# Patient Record
Sex: Female | Born: 1935 | Race: White | Hispanic: No | Marital: Single | Smoking: Never smoker
Health system: Southern US, Community
[De-identification: ages and names within clinical notes are randomized; demographics above are authoritative.]

## PROBLEM LIST (undated history)

## (undated) DIAGNOSIS — F0390 Unspecified dementia without behavioral disturbance: Secondary | ICD-10-CM

## (undated) DIAGNOSIS — I10 Essential (primary) hypertension: Secondary | ICD-10-CM

---

## 2016-09-23 DIAGNOSIS — F0150 Vascular dementia without behavioral disturbance: Secondary | ICD-10-CM | POA: Insufficient documentation

## 2018-12-29 DIAGNOSIS — M150 Primary generalized (osteo)arthritis: Secondary | ICD-10-CM | POA: Diagnosis present

## 2018-12-29 DIAGNOSIS — M159 Polyosteoarthritis, unspecified: Secondary | ICD-10-CM | POA: Diagnosis present

## 2019-05-05 ENCOUNTER — Other Ambulatory Visit: Payer: Self-pay

## 2019-05-05 ENCOUNTER — Encounter: Payer: Self-pay | Admitting: Emergency Medicine

## 2019-05-05 ENCOUNTER — Emergency Department
Admission: EM | Admit: 2019-05-05 | Discharge: 2019-05-05 | Disposition: A | Discharge status: Home / Self Care | Payer: Medicare HMO | Attending: Emergency Medicine | Admitting: Emergency Medicine

## 2019-05-05 ENCOUNTER — Emergency Department: Payer: Medicare HMO

## 2019-05-05 DIAGNOSIS — S99921A Unspecified injury of right foot, initial encounter: Secondary | ICD-10-CM | POA: Diagnosis present

## 2019-05-05 DIAGNOSIS — W19XXXA Unspecified fall, initial encounter: Secondary | ICD-10-CM | POA: Insufficient documentation

## 2019-05-05 DIAGNOSIS — Y939 Activity, unspecified: Secondary | ICD-10-CM | POA: Diagnosis not present

## 2019-05-05 DIAGNOSIS — S92414A Nondisplaced fracture of proximal phalanx of right great toe, initial encounter for closed fracture: Secondary | ICD-10-CM | POA: Diagnosis not present

## 2019-05-05 DIAGNOSIS — Y929 Unspecified place or not applicable: Secondary | ICD-10-CM | POA: Diagnosis not present

## 2019-05-05 DIAGNOSIS — Y999 Unspecified external cause status: Secondary | ICD-10-CM | POA: Diagnosis not present

## 2019-05-05 MED ORDER — ACETAMINOPHEN 500 MG PO TABS
1000.0000 mg | ORAL_TABLET | Freq: Once | ORAL | Status: AC
  Administered 2019-05-05: 1000 mg via ORAL
  Filled 2019-05-05: qty 2

## 2019-05-05 NOTE — ED Notes (Signed)
This RN spoke with Amy, staff at group home about discharge. States they will be here to pick up pt in 66min

## 2019-05-05 NOTE — ED Notes (Signed)
Legal guardian Leroy Libman attempted to call at this time with no success

## 2019-05-05 NOTE — ED Provider Notes (Signed)
[facility] Emergency Department Provider Note       Time seen: ----------------------------------------- 9:31 AM on 05/05/2019 -----------------------------------------   I have reviewed the triage vital signs and the nursing notes.  HISTORY   Chief Complaint No chief complaint on file.    HPI Kayla Fowler is a 85 y.o. female with unknown past medical history who presents to the ED for right great toe pain after unwitnessed fall.  She arrives alert and oriented to place and person but is otherwise disoriented.  We are not sure if she is at her baseline mentally or not.  She currently lives in a group home.  Reportedly she injured her right great toe.  No past medical history on file.  There are no active problems to display for this patient.  Allergies Ibuprofen and Percocet [oxycodone-acetaminophen]  Social History Social History   Tobacco Use  . Smoking status: Not on file  Substance Use Topics  . Alcohol use: Not on file  . Drug use: Not on file   Review of Systems Constitutional: Negative for fever. Cardiovascular: Negative for chest pain. Respiratory: Negative for shortness of breath. Gastrointestinal: Negative for abdominal pain, vomiting and diarrhea. Musculoskeletal: Positive for right great toe pain Skin: Positive for right great toe and right lower extremity bruising Neurological: Negative for headaches, focal weakness or numbness.  All systems negative/normal/unremarkable except as stated in the HPI  ____________________________________________   PHYSICAL EXAM:  VITAL SIGNS: ED Triage Vitals [05/05/19 0929]  Enc Vitals Group     BP (!) 185/95     Pulse Rate 81     Resp 14     Temp      Temp Source Oral     SpO2 100 %     Weight      Height      Head Circumference      Peak Flow      Pain Score      Pain Loc      Pain Edu?      Excl. in [place]?    Constitutional: Alert but disoriented.  No acute distress Eyes:  Conjunctivae are normal. Normal extraocular movements. ENT      Head: Normocephalic and atraumatic.      Nose: No congestion/rhinnorhea.      Mouth/Throat: Mucous membranes are moist.      Neck: No stridor. Cardiovascular: Normal rate, regular rhythm. No murmurs, rubs, or gallops. Respiratory: Normal respiratory effort without tachypnea nor retractions. Breath sounds are clear and equal bilaterally. No wheezes/rales/rhonchi. Musculoskeletal: There are bruises over the right pretibial area, there is a chronic appearing deformity of the right great toe with significant valgus and bunion formation.  The right great toe overlaps the second digit.  There is dorsal bruising noted Neurologic:  Normal speech and language. No gross focal neurologic deficits are appreciated.  Skin: Contusion noted over the right distal shin, contusion noted over the dorsum of the right great toe ____________________________________________  ED COURSE:  As part of my medical decision making, I reviewed the following data within the electronic MEDICAL RECORD NUMBER History obtained from family if available, nursing notes, old chart and ekg, as well as notes from prior ED visits. Patient presented for a fall with right great toe pain, we will assess with labs and imaging as indicated at this time.   Procedures  Kayla Fowler was evaluated in Emergency Department on 05/05/2019 for the symptoms described in the history of present illness. She was evaluated in  the context of the global COVID-19 pandemic, which necessitated consideration that the patient might be at risk for infection with the SARS-CoV-2 virus that causes COVID-19. Institutional protocols and algorithms that pertain to the evaluation of patients at risk for COVID-19 are in a state of rapid change based on information released by regulatory bodies including the CDC and federal and state organizations. These policies and algorithms were followed during the patient's care  in the ED.  ____________________________________________   RADIOLOGY Images were viewed by me  Right great toe x-ray IMPRESSION:  Transverse nondisplaced fracture at base of proximal phalanx RIGHT  great toe.   Hallux valgus with bunion deformity.  ____________________________________________   DIFFERENTIAL DIAGNOSIS   Fracture, contusion, dislocation, chronic deformity  FINAL ASSESSMENT AND PLAN  Right great toe pain, proximal phalanx fracture   Plan: The patient had presented for right great toe pain after an unwitnessed fall.  Patient's imaging did reveal a proximal phalanx fracture.  She was placed in a cast shoe and is otherwise cleared for outpatient follow-up.   Laurence Aly, MD    Note: This note was generated in part or whole with voice recognition software. Voice recognition is usually quite accurate but there are transcription errors that can and very often do occur. I apologize for any typographical errors that were not detected and corrected.     Earleen Newport, MD 05/05/19 1120

## 2019-05-05 NOTE — ED Notes (Signed)
Patient ambulatory to restroom with 1 person assistance. Patient complaining of pain in right foot when walking.

## 2019-05-05 NOTE — ED Notes (Addendum)
Group home called at this time. [phone]  Per staff Amy,  pt had mechanical fall this am Pt is at baseline cognition A&OX2 and deformity to toe is at baseline  MD aware

## 2019-05-05 NOTE — ED Notes (Signed)
Pt given to staff from group home for discharge , pt unable to sign e-signature due to baseline cognition

## 2019-05-05 NOTE — ED Triage Notes (Signed)
PT from group home, [place]. C/o RT great toe pain after unwitnessed fall. Pt is A&Ox2, possible at baseline but EMS unaware. VSS. MD at bedside

## 2019-05-13 ENCOUNTER — Emergency Department: Payer: Medicare HMO

## 2019-05-13 ENCOUNTER — Other Ambulatory Visit: Payer: Self-pay

## 2019-05-13 ENCOUNTER — Emergency Department
Admission: EM | Admit: 2019-05-13 | Discharge: 2019-05-13 | Disposition: A | Discharge status: Home / Self Care | Payer: Medicare HMO | Attending: Emergency Medicine | Admitting: Emergency Medicine

## 2019-05-13 DIAGNOSIS — Y92122 Bedroom in nursing home as the place of occurrence of the external cause: Secondary | ICD-10-CM | POA: Insufficient documentation

## 2019-05-13 DIAGNOSIS — W06XXXA Fall from bed, initial encounter: Secondary | ICD-10-CM | POA: Insufficient documentation

## 2019-05-13 DIAGNOSIS — S0181XA Laceration without foreign body of other part of head, initial encounter: Secondary | ICD-10-CM | POA: Diagnosis present

## 2019-05-13 DIAGNOSIS — Y939 Activity, unspecified: Secondary | ICD-10-CM | POA: Diagnosis not present

## 2019-05-13 DIAGNOSIS — Y999 Unspecified external cause status: Secondary | ICD-10-CM | POA: Insufficient documentation

## 2019-05-13 DIAGNOSIS — Z79899 Other long term (current) drug therapy: Secondary | ICD-10-CM | POA: Diagnosis not present

## 2019-05-13 DIAGNOSIS — S022XXA Fracture of nasal bones, initial encounter for closed fracture: Secondary | ICD-10-CM | POA: Insufficient documentation

## 2019-05-13 MED ORDER — LIDOCAINE HCL (PF) 1 % IJ SOLN
INTRAMUSCULAR | Status: AC
  Filled 2019-05-13: qty 5

## 2019-05-13 NOTE — ED Triage Notes (Signed)
Patient coming in EMS from group home for fall.  group home stated she rolled out of bed. Patient does not seem to understand or know what happened but EMS was told that this is baseline. Patient has laceration on right upper eye and broken nose. Patient not on blood thinners.

## 2019-05-13 NOTE — ED Notes (Signed)
Spoke with group home -Amy and gave update

## 2019-05-13 NOTE — ED Provider Notes (Signed)
[facility] Emergency Department Provider Note ___________________   First MD Initiated Contact with Patient 05/13/19 [phone]     (approximate)  I have reviewed the triage vital signs and the nursing notes.   HISTORY  Chief Complaint Fall   HPI Kayla Fowler is a 85 y.o. female resents to the emergency department from a group home secondary to fall.  EMS states that the patient "rolled out of bed".  They suspect that the patient injured her forehead against bedside nightstand.  Patient however gives a nonsensical history regarding "neighbor and replacing something on the ceiling".   We are unclear at this time if this is the patient's baseline mental status.       No past medical history on file.  There are no active problems to display for this patient.   No past surgical history on file.  Prior to Admission medications   Medication Sig Start Date End Date Taking? Authorizing Provider  acetaminophen (TYLENOL) 500 MG tablet Take 1,000 mg by mouth every 8 (eight) hours.    [provider]  diclofenac sodium (VOLTAREN) 1 % GEL Apply 2 g topically 2 (two) times daily. (apply to right shoulder area)    [provider]  DULoxetine (CYMBALTA) 20 MG capsule Take 20 mg by mouth daily.    [provider]  Ergocalciferol (VITAMIN D2) 50 MCG (2000 UT) TABS Take 2,000 Units by mouth daily.    [provider]  Melatonin 3 MG TABS Take 3 mg by mouth every evening.    [provider]  Menthol (RICOLA HONEY HERB) 2 MG LOZG Use as directed 1 lozenge in the mouth or throat every 3 (three) hours as needed (sore throat).    [provider]  mirtazapine (REMERON) 15 MG tablet Take 7.5 mg by mouth at bedtime.    [provider]  Multiple Vitamins-Minerals (THEREMS-M) TABS Take 1 tablet by mouth daily.    [provider]  polyethylene glycol (MIRALAX / GLYCOLAX) 17 g packet Take 17 g by mouth every other day.     [provider]  senna (SENOKOT) 8.6 MG TABS tablet Take 1 tablet by mouth at bedtime as needed for mild constipation.    [provider]    Allergies Ibuprofen, Percocet [oxycodone-acetaminophen], and Triple antibiotic [bacitracin-neomycin-polymyxin]  No family history on file.  Social History Social History   Tobacco Use   Smoking status: Not on file  Substance Use Topics   Alcohol use: Not on file   Drug use: Not on file    Review of Systems Constitutional: No fever/chills Eyes: No visual changes. ENT: No sore throat. Cardiovascular: Denies chest pain. Respiratory: Denies shortness of breath. Gastrointestinal: No abdominal pain.  No nausea, no vomiting.  No diarrhea.  No constipation. Genitourinary: Negative for dysuria. Musculoskeletal: Negative for neck pain.  Negative for back pain. Integumentary: Negative for rash. Neurological: Negative for headaches, focal weakness or numbness.   ____________________________________________   PHYSICAL EXAM:  VITAL SIGNS: ED Triage Vitals  Enc Vitals Group     BP 05/13/19 0055 (!) 162/88     Pulse Rate 05/13/19 0055 85     Resp 05/13/19 0055 17     Temp 05/13/19 0055 97.6 F (36.4 C)     Temp Source 05/13/19 0055 Oral     SpO2 05/13/19 0055 97 %     Weight 05/13/19 0056 54.4 kg (120 lb)     Height --      Head  Circumference --      Peak Flow --      Pain Score 05/13/19 0055 2     Pain Loc --      Pain Edu? --      Excl. in [place]? --     Constitutional: Alert and oriented.  Disoriented to situation Eyes: Conjunctivae are normal.  Head: 3 cm right forehead laceration.  Nasal bridge ecchymosis with deviation to the left Mouth/Throat: Mucous membranes are moist. Neck: No stridor.  No meningeal signs.   Cardiovascular: Normal rate, regular rhythm. Good peripheral circulation. Grossly normal heart sounds. Respiratory: Normal respiratory effort.  No retractions. Gastrointestinal: Soft and nontender.  No distention.  Musculoskeletal: No lower extremity tenderness nor edema. No gross deformities of extremities. Neurologic:  Normal speech and language. No gross focal neurologic deficits are appreciated.  Skin: 3 cm linear right forehead laceration Psychiatric: Mood and affect are normal. Speech and behavior are normal.  ____  RADIOLOGY I, [place] N Leanza Shepperson, personally viewed and evaluated these images (plain radiographs) as part of my medical decision making, as well as reviewing the written report by the radiologist.  ED MD interpretation: No acute intracranial abnormality noted small right frontal scalp hematoma without skull fracture.  Nasal bone fracture  Official radiology report(s): Dg Shoulder Right  Result Date: 05/13/2019 CLINICAL DATA:  Right-sided chest pain after fall EXAM: RIGHT SHOULDER - 2+ VIEW COMPARISON:  None. FINDINGS: No fracture or dislocation. High-riding humeral head consistent with rotator cuff disease. AC joint degenerative change. The right lung apex is clear IMPRESSION: 1. No acute osseous abnormality 2. High-riding humeral head consistent with rotator cuff disease. 3. Arthritis/degenerative change at the [facility] joint and glenohumeral interval Electronically Signed   By: Donavan Foil M.D.   On: 05/13/2019 01:48   Ct Head Wo Contrast  Result Date: 05/13/2019 CLINICAL DATA:  Fall EXAM: CT HEAD WITHOUT CONTRAST CT MAXILLOFACIAL WITHOUT CONTRAST CT CERVICAL SPINE WITHOUT CONTRAST TECHNIQUE: Multidetector CT imaging of the head, cervical spine, and maxillofacial structures were performed using the standard protocol without intravenous contrast. Multiplanar CT image reconstructions of the cervical spine and maxillofacial structures were also generated. COMPARISON:  None. FINDINGS: CT HEAD FINDINGS Brain: There is no mass, hemorrhage or extra-axial collection. The size and configuration of the ventricles and extra-axial CSF spaces are normal. The brain parenchyma is normal,  without evidence of acute or chronic infarction. Vascular: No abnormal hyperdensity of the major intracranial arteries or dural venous sinuses. No intracranial atherosclerosis. Skull: Small right frontal scalp hematoma.  No skull fracture. CT MAXILLOFACIAL FINDINGS Osseous: --Complex facial fracture types: No LeFort, zygomaticomaxillary complex or nasoorbitoethmoidal fracture. --Simple fracture types: Mildly leftward lead displaced fracture of the nasal bones. --Mandible: No fracture or dislocation. Orbits: The globes are intact. Normal appearance of the intra- and extraconal fat. Symmetric extraocular muscles and optic nerves. Sinuses: No fluid levels or advanced mucosal thickening. Soft tissues: Normal visualized extracranial soft tissues. CT CERVICAL SPINE FINDINGS Alignment: There is reversal of normal cervical lordosis with grade 1 anterolisthesis at C2-3, C3-4 and C4-5 with grade 1 retrolisthesis at C5-6. These findings are likely due to facet arthrosis. Skull base and vertebrae: No acute fracture. Soft tissues and spinal canal: No prevertebral fluid or swelling. No visible canal hematoma. Disc levels: There is severe left foraminal stenosis at C5-6. Moderate spinal canal stenosis also at C5-6 level. There is moderate-to-severe facet arthrosis throughout the cervical spine. Upper chest: Multiple nodular opacities in the right lung apex, measuring up to 5 mm.  Other: Normal visualized paraspinal cervical soft tissues. IMPRESSION: 1. No acute intracranial abnormality. 2. Small right frontal scalp hematoma without skull fracture. 3. Mildly leftward displaced nasal bone fracture. 4. No acute fracture of the cervical spine. 5. C5-6 Moderate spinal canal and severe left foraminal stenosis. 6. Multilevel cervical spondylolisthesis due facet arthrosis. 7. Multiple small right apical pulmonary nodules, measuring up to 5 mm. No follow-up needed if patient is low-risk (and has no known or suspected primary neoplasm).  Non-contrast chest CT can be considered in 12 months if patient is high-risk. This recommendation follows the consensus statement: Guidelines for Management of Incidental Pulmonary Nodules Detected on CT Images: From the Fleischner Society 2017; Radiology 2017; 284:228-243. Electronically Signed   By: Deatra Robinson M.D.   On: 05/13/2019 02:39   Ct Cervical Spine Wo Contrast  Result Date: 05/13/2019 CLINICAL DATA:  Fall EXAM: CT HEAD WITHOUT CONTRAST CT MAXILLOFACIAL WITHOUT CONTRAST CT CERVICAL SPINE WITHOUT CONTRAST TECHNIQUE: Multidetector CT imaging of the head, cervical spine, and maxillofacial structures were performed using the standard protocol without intravenous contrast. Multiplanar CT image reconstructions of the cervical spine and maxillofacial structures were also generated. COMPARISON:  None. FINDINGS: CT HEAD FINDINGS Brain: There is no mass, hemorrhage or extra-axial collection. The size and configuration of the ventricles and extra-axial CSF spaces are normal. The brain parenchyma is normal, without evidence of acute or chronic infarction. Vascular: No abnormal hyperdensity of the major intracranial arteries or dural venous sinuses. No intracranial atherosclerosis. Skull: Small right frontal scalp hematoma.  No skull fracture. CT MAXILLOFACIAL FINDINGS Osseous: --Complex facial fracture types: No LeFort, zygomaticomaxillary complex or nasoorbitoethmoidal fracture. --Simple fracture types: Mildly leftward lead displaced fracture of the nasal bones. --Mandible: No fracture or dislocation. Orbits: The globes are intact. Normal appearance of the intra- and extraconal fat. Symmetric extraocular muscles and optic nerves. Sinuses: No fluid levels or advanced mucosal thickening. Soft tissues: Normal visualized extracranial soft tissues. CT CERVICAL SPINE FINDINGS Alignment: There is reversal of normal cervical lordosis with grade 1 anterolisthesis at C2-3, C3-4 and C4-5 with grade 1 retrolisthesis at  C5-6. These findings are likely due to facet arthrosis. Skull base and vertebrae: No acute fracture. Soft tissues and spinal canal: No prevertebral fluid or swelling. No visible canal hematoma. Disc levels: There is severe left foraminal stenosis at C5-6. Moderate spinal canal stenosis also at C5-6 level. There is moderate-to-severe facet arthrosis throughout the cervical spine. Upper chest: Multiple nodular opacities in the right lung apex, measuring up to 5 mm. Other: Normal visualized paraspinal cervical soft tissues. IMPRESSION: 1. No acute intracranial abnormality. 2. Small right frontal scalp hematoma without skull fracture. 3. Mildly leftward displaced nasal bone fracture. 4. No acute fracture of the cervical spine. 5. C5-6 Moderate spinal canal and severe left foraminal stenosis. 6. Multilevel cervical spondylolisthesis due facet arthrosis. 7. Multiple small right apical pulmonary nodules, measuring up to 5 mm. No follow-up needed if patient is low-risk (and has no known or suspected primary neoplasm). Non-contrast chest CT can be considered in 12 months if patient is high-risk. This recommendation follows the consensus statement: Guidelines for Management of Incidental Pulmonary Nodules Detected on CT Images: From the Fleischner Society 2017; Radiology 2017; [id number]. Electronically Signed   By: Deatra Robinson M.D.   On: 05/13/2019 02:39   Dg Chest Portable 1 View  Result Date: 05/13/2019 CLINICAL DATA:  Right chest pain EXAM: PORTABLE CHEST 1 VIEW COMPARISON:  None. FINDINGS: There is mild cardiomegaly. Aortic knob calcifications. Mildly increased interstitial markings  seen at both lung bases. No pneumothorax or large airspace consolidation. 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 Left shoulder arthroplasty is noted. There is diffuse osteopenia. IMPRESSION: Mildly increased interstitial markings at both lung bases. This could be due to atelectasis and/or chronic lung changes. Mild  cardiomegaly. Electronically Signed   By: Jonna ClarkBindu  Avutu M.D.   On: 05/13/2019 01:48   Ct Maxillofacial Wo Contrast  Result Date: 05/13/2019 CLINICAL DATA:  Fall EXAM: CT HEAD WITHOUT CONTRAST CT MAXILLOFACIAL WITHOUT CONTRAST CT CERVICAL SPINE WITHOUT CONTRAST TECHNIQUE: Multidetector CT imaging of the head, cervical spine, and maxillofacial structures were performed using the standard protocol without intravenous contrast. Multiplanar CT image reconstructions of the cervical spine and maxillofacial structures were also generated. COMPARISON:  None. FINDINGS: CT HEAD FINDINGS Brain: There is no mass, hemorrhage or extra-axial collection. The size and configuration of the ventricles and extra-axial CSF spaces are normal. The brain parenchyma is normal, without evidence of acute or chronic infarction. Vascular: No abnormal hyperdensity of the major intracranial arteries or dural venous sinuses. No intracranial atherosclerosis. Skull: Small right frontal scalp hematoma.  No skull fracture. CT MAXILLOFACIAL FINDINGS Osseous: --Complex facial fracture types: No LeFort, zygomaticomaxillary complex or nasoorbitoethmoidal fracture. --Simple fracture types: Mildly leftward lead displaced fracture of the nasal bones. --Mandible: No fracture or dislocation. Orbits: The globes are intact. Normal appearance of the intra- and extraconal fat. Symmetric extraocular muscles and optic nerves. Sinuses: No fluid levels or advanced mucosal thickening. Soft tissues: Normal visualized extracranial soft tissues. CT CERVICAL SPINE FINDINGS Alignment: There is reversal of normal cervical lordosis with grade 1 anterolisthesis at C2-3, C3-4 and C4-5 with grade 1 retrolisthesis at C5-6. These findings are likely due to facet arthrosis. Skull base and vertebrae: No acute fracture. Soft tissues and spinal canal: No prevertebral fluid or swelling. No visible canal hematoma. Disc levels: There is severe left foraminal stenosis at C5-6. Moderate  spinal canal stenosis also at C5-6 level. There is moderate-to-severe facet arthrosis throughout the cervical spine. Upper chest: Multiple nodular opacities in the right lung apex, measuring up to 5 mm. Other: Normal visualized paraspinal cervical soft tissues. IMPRESSION: 1. No acute intracranial abnormality. 2. Small right frontal scalp hematoma without skull fracture. 3. Mildly leftward displaced nasal bone fracture. 4. No acute fracture of the cervical spine. 5. C5-6 Moderate spinal canal and severe left foraminal stenosis. 6. Multilevel cervical spondylolisthesis due facet arthrosis. 7. Multiple small right apical pulmonary nodules, measuring up to 5 mm. No follow-up needed if patient is low-risk (and has no known or suspected primary neoplasm). Non-contrast chest CT can be considered in 12 months if patient is high-risk. This recommendation follows the consensus statement: Guidelines for Management of Incidental Pulmonary Nodules Detected on CT Images: From the Fleischner Society 2017; Radiology 2017; [id number]. Electronically Signed   By: Deatra RobinsonKevin  Herman M.D.   On: 05/13/2019 02:39     .Marland Kitchen.Laceration Repair  Date/Time: 05/13/2019 5:16 AM Performed by: Darci CurrentBrown, [place] N, MD Authorized by: Darci CurrentBrown, [place] N, MD   Consent:    Consent obtained:  Verbal   Consent given by:  Patient   Risks discussed:  Infection, pain, retained foreign body, poor cosmetic result and poor wound healing Anesthesia (see MAR for exact dosages):    Anesthesia method:  Local infiltration   Local anesthetic:  Lidocaine 1% w/o epi Laceration details:    Location:  Face   Face location:  Forehead   Length (cm):  3 Repair type:    Repair type:  Simple Exploration:  Hemostasis achieved with:  Direct pressure   Wound exploration: entire depth of wound probed and visualized     Contaminated: no   Treatment:    Area cleansed with:  Saline   Amount of cleaning:  Extensive   Irrigation solution:  Sterile saline    Visualized foreign bodies/material removed: no   Skin repair:    Repair method:  Sutures   Suture size:  6-0   Suture technique:  Simple interrupted Approximation:    Approximation:  Close Post-procedure details:    Dressing:  Sterile dressing   Patient tolerance of procedure:  Tolerated well, no immediate complications     ____________________________________________   INITIAL IMPRESSION / MDM / ASSESSMENT AND PLAN / ED COURSE  As part of my medical decision making, I reviewed the following data within the electronic MEDICAL RECORD NUMBER  85 year old female presented with above-stated history and physical exam secondary to fall.  Patient with a laceration to the forehead that was repaired without difficulty clinical exam concerning for possible nasal bone fracture which was confirmed on CT.      ____________________________________________  FINAL CLINICAL IMPRESSION(S) / ED DIAGNOSES  Final diagnoses:  Laceration of forehead, initial encounter  Closed fracture of nasal bone, initial encounter     MEDICATIONS GIVEN DURING THIS VISIT:  Medications  lidocaine (PF) (XYLOCAINE) 1 % injection (has no administration in time range)     ED Discharge Orders    None      *Please note:  Tyeisha Dinan was evaluated in Emergency Department on 05/13/2019 for the symptoms described in the history of present illness. She was evaluated in the context of the global COVID-19 pandemic, which necessitated consideration that the patient might be at risk for infection with the SARS-CoV-2 virus that causes COVID-19. Institutional protocols and algorithms that pertain to the evaluation of patients at risk for COVID-19 are in a state of rapid change based on information released by regulatory bodies including the CDC and federal and state organizations. These policies and algorithms were followed during the patient's care in the ED.  Some ED evaluations and interventions may be delayed as a result of  limited staffing during the pandemic.*  Note:  This document was prepared using Dragon voice recognition software and may include unintentional dictation errors.   Darci Current, MD 05/13/19 [phone]

## 2019-05-13 NOTE — ED Notes (Signed)
Called Group home to let them know EMS was on their way

## 2019-05-13 NOTE — ED Notes (Signed)
EMS here to bring back group home

## 2019-05-13 NOTE — ED Notes (Signed)
MD at bedside for suture laceration repair.

## 2019-05-13 NOTE — ED Notes (Signed)
Spoke with legal guardian Leroy Libman about return to group home

## 2019-10-18 ENCOUNTER — Other Ambulatory Visit: Payer: Self-pay

## 2019-10-18 ENCOUNTER — Encounter: Payer: Self-pay | Admitting: *Deleted

## 2019-10-18 ENCOUNTER — Emergency Department: Payer: Medicare HMO

## 2019-10-18 ENCOUNTER — Emergency Department
Admission: EM | Admit: 2019-10-18 | Discharge: 2019-10-19 | Disposition: A | Discharge status: Home / Self Care | Payer: Medicare HMO | Attending: Emergency Medicine | Admitting: Emergency Medicine

## 2019-10-18 DIAGNOSIS — M25511 Pain in right shoulder: Secondary | ICD-10-CM | POA: Insufficient documentation

## 2019-10-18 DIAGNOSIS — Z79899 Other long term (current) drug therapy: Secondary | ICD-10-CM | POA: Diagnosis not present

## 2019-10-18 DIAGNOSIS — R911 Solitary pulmonary nodule: Secondary | ICD-10-CM | POA: Insufficient documentation

## 2019-10-18 DIAGNOSIS — R05 Cough: Secondary | ICD-10-CM | POA: Insufficient documentation

## 2019-10-18 DIAGNOSIS — R0789 Other chest pain: Secondary | ICD-10-CM | POA: Diagnosis present

## 2019-10-18 DIAGNOSIS — R059 Cough, unspecified: Secondary | ICD-10-CM

## 2019-10-18 LAB — CBC
HCT: 41.2 % (ref 36.0–46.0)
Hemoglobin: 13.2 g/dL (ref 12.0–15.0)
MCH: 31.3 pg (ref 26.0–34.0)
MCHC: 32 g/dL (ref 30.0–36.0)
MCV: 97.6 fL (ref 80.0–100.0)
Platelets: 333 10*3/uL (ref 150–400)
RBC: 4.22 MIL/uL (ref 3.87–5.11)
RDW: 13.5 % (ref 11.5–15.5)
WBC: 5.8 10*3/uL (ref 4.0–10.5)
nRBC: 0 % (ref 0.0–0.2)

## 2019-10-18 LAB — BASIC METABOLIC PANEL
Anion gap: 10 (ref 5–15)
BUN: 25 mg/dL — ABNORMAL HIGH (ref 8–23)
CO2: 30 mmol/L (ref 22–32)
Calcium: 9.6 mg/dL (ref 8.9–10.3)
Chloride: 99 mmol/L (ref 98–111)
Creatinine, Ser: 0.89 mg/dL (ref 0.44–1.00)
GFR calc Af Amer: >60 mL/min (ref >60)
GFR calc non Af Amer: 57 mL/min — ABNORMAL LOW (ref >60)
Glucose, Bld: 136 mg/dL — ABNORMAL HIGH (ref 70–99)
Potassium: 4.2 mmol/L (ref 3.5–5.1)
Sodium: 139 mmol/L (ref 135–145)

## 2019-10-18 LAB — TROPONIN I (HIGH SENSITIVITY): Troponin I (High Sensitivity): 9 ng/L (ref <18)

## 2019-10-18 NOTE — ED Provider Notes (Signed)
[facility] Emergency Department Provider Note  ____________________________________________   I have reviewed the triage vital signs and the nursing notes.   HISTORY  Chief Complaint Cough and Epigastric pain   History limited by: Dementia   HPI Kayla Fowler is a 86 y.o. female who presents to the emergency department today with primary concern for central chest discomfort and coughing. The patient states that it started shortly after she ate something. She cannot remember what she ate. She says that the coughing was productive of clear phlegm. It is somewhat unclear if this has happened to her before, she both states it started today and that it has always happened. The patient denies any current discomfort at the time of my examination. In addition the patient says that her right shoulder has been hurting her for a long time and that no one has looked at it.  Records reviewed. Per medical record review patient has a history of right shoulder x-ray roughly 2 years ago through [facility] which showed significant OA.   History reviewed. No pertinent past medical history.  There are no problems to display for this patient.   History reviewed. No pertinent surgical history.  Prior to Admission medications   Medication Sig Start Date End Date Taking? Authorizing Provider  acetaminophen (TYLENOL) 500 MG tablet Take 1,000 mg by mouth every 8 (eight) hours.    [provider]  diclofenac sodium (VOLTAREN) 1 % GEL Apply 2 g topically 2 (two) times daily. (apply to right shoulder area)    [provider]  DULoxetine (CYMBALTA) 20 MG capsule Take 20 mg by mouth daily.    [provider]  Ergocalciferol (VITAMIN D2) 50 MCG (2000 UT) TABS Take 2,000 Units by mouth daily.    [provider]  Melatonin 3 MG TABS Take 3 mg by mouth every evening.    [provider]  Menthol (RICOLA HONEY HERB) 2 MG LOZG Use as directed 1 lozenge in the  mouth or throat every 3 (three) hours as needed (sore throat).    [provider]  mirtazapine (REMERON) 15 MG tablet Take 7.5 mg by mouth at bedtime.    [provider]  Multiple Vitamins-Minerals (THEREMS-M) TABS Take 1 tablet by mouth daily.    [provider]  polyethylene glycol (MIRALAX / GLYCOLAX) 17 g packet Take 17 g by mouth every other day.    [provider]  senna (SENOKOT) 8.6 MG TABS tablet Take 1 tablet by mouth at bedtime as needed for mild constipation.    [provider]    Allergies Ibuprofen, Percocet [oxycodone-acetaminophen], and Triple antibiotic [bacitracin-neomycin-polymyxin]  History reviewed. No pertinent family history.  Social History Social History   Tobacco Use  . Smoking status: Never Smoker  . Smokeless tobacco: Never Used  Substance Use Topics  . Alcohol use: Never  . Drug use: Never    Review of Systems Constitutional: No fever/chills Eyes: No visual changes. ENT: No sore throat. Cardiovascular: Positive for chest pain. Respiratory: Denies shortness of breath. Positive for cough. Gastrointestinal: No abdominal pain.  No nausea, no vomiting.  No diarrhea.   Genitourinary: Negative for dysuria. Musculoskeletal: Positive for right shoulder pain. Skin: Negative for rash. Neurological: Negative for headaches, focal weakness or numbness.  ____________________________________________   PHYSICAL EXAM:  VITAL SIGNS: ED Triage Vitals  Enc Vitals Group     BP 10/18/19 2037 (!) 156/68     Pulse Rate 10/18/19 2037 77     Resp 10/18/19  2037 16     Temp 10/18/19 2037 97.8 F (36.6 C)     Temp Source 10/18/19 2037 Oral     SpO2 10/18/19 2037 99 %     Weight 10/18/19 2038 122 lb (55.3 kg)     Height 10/18/19 2038 5\' 7"  (1.702 m)     Head Circumference --      Peak Flow --      Pain Score 10/18/19 2038 8   Constitutional: Alert and oriented.  Eyes: Conjunctivae are normal.  ENT      Head:  Normocephalic and atraumatic.      Nose: No congestion/rhinnorhea.      Mouth/Throat: Mucous membranes are moist.      Neck: No stridor. Hematological/Lymphatic/Immunilogical: No cervical lymphadenopathy. Cardiovascular: Normal rate, regular rhythm.  No murmurs, rubs, or gallops.  Respiratory: Normal respiratory effort without tachypnea nor retractions. Breath sounds are clear and equal bilaterally. No wheezes/rales/rhonchi. Gastrointestinal: Soft and non tender. No rebound. No guarding.  Genitourinary: Deferred Musculoskeletal: Deformity to the right shoulder. ROM limited secondary to pain.  Neurologic:  Normal speech and language. No gross focal neurologic deficits are appreciated.  Skin:  Skin is warm, dry and intact. No rash noted. Psychiatric: Mood and affect are normal. Speech and behavior are normal. Patient exhibits appropriate insight and judgment.  ____________________________________________    LABS (pertinent positives/negatives)  Trop hs 9 CBC wbc 5.8, hgb 13.2, plt 333 BMP wnl except glu 136, bun 25  ____________________________________________   EKG  I, Nance Pear, attending physician, personally viewed and interpreted this EKG  EKG Time: 2035 Rate: 78 Rhythm: normal sinus rhythm Axis: normal Intervals: qtc 437 QRS: narrow ST changes: no st elevation Impression: normal ekg   ____________________________________________    RADIOLOGY  CXR Nodular opacity in the right lung  Right shoulder No acute fracture or dislocation. Chronic disease  CT chest Right middle lobe collapse. Pulmonary nodule. Question bronchitis.  ____________________________________________   PROCEDURES  Procedures  ____________________________________________   INITIAL IMPRESSION / ASSESSMENT AND PLAN / ED COURSE  Pertinent labs & imaging results that were available during my care of the patient were reviewed by me and considered in my medical decision making (see  chart for details).   Patient presented to the emergency department today because of concerns for episode of some coughing and chest discomfort after eating.  On exam here patient in no distress.  Not having any coughing.  Chest x-ray did show pulmonary nodule so CT was obtained.  This again showed pulmonary nodules and collapse of the right middle lobe.  Do not think that this is an acute finding.  I discussed both findings with patient.  At this point I think is reasonable for patient be discharged home.  Will give patient pulmonology follow-up information and primary care follow-up information.   ____________________________________________   FINAL CLINICAL IMPRESSION(S) / ED DIAGNOSES  Final diagnoses:  Cough  Pulmonary nodule     Note: This dictation was prepared with Dragon dictation. Any transcriptional errors that result from this process are unintentional     Nance Pear, MD 10/18/19 2333

## 2019-10-18 NOTE — Discharge Instructions (Signed)
Please follow up with your primary care doctor about the CT findings today of a pulmonary nodule. Repeat imaging will have to be obtained in 6-12 months. Additionally as we discussed part of your lung is not fully inflated, which is likely a chronic issue. This however can be followed up by your physician or the pulmonologist. Please seek medical attention for any high fevers, chest pain, shortness of breath, change in behavior, persistent vomiting, bloody stool or any other new or concerning symptoms.

## 2019-10-18 NOTE — ED Notes (Signed)
Confirmed with [name] (legal guardian) that pt currently lives at [facility] care home. Owners number listed in patient's contacts.

## 2019-10-18 NOTE — ED Notes (Signed)
Pt transported to CT and XR

## 2019-10-18 NOTE — ED Triage Notes (Addendum)
Pt to ED reporting epigastric pain that started after eating dinner. Pt reports after eating she started trying to cough up phlegm but then started having a burning epigastric and throat pain. Pt reports pain is persisting and voice is hoarse upon arrival. No SOB reported.   Pt repeatedly stating her right shoulder is hurting and she has not been able to have it assessed. Pt very upset with group home and repeatedly states she does not like it there.

## 2019-10-19 NOTE — ED Notes (Signed)
Unable to obtain e-signature at d/c d/t pt's mental status and confusion at baseline.

## 2020-03-26 ENCOUNTER — Emergency Department: Payer: Medicare HMO

## 2020-03-26 ENCOUNTER — Inpatient Hospital Stay
Admission: EM | Admit: 2020-03-26 | Discharge: 2020-04-02 | DRG: 521 | Disposition: A | Discharge status: Skilled Nursing Facility | Payer: Medicare HMO | Source: Skilled Nursing Facility | Attending: Internal Medicine | Admitting: Internal Medicine

## 2020-03-26 ENCOUNTER — Encounter
Admission: EM | Disposition: A | Discharge status: Skilled Nursing Facility | Payer: Self-pay | Source: Skilled Nursing Facility | Attending: Internal Medicine

## 2020-03-26 ENCOUNTER — Inpatient Hospital Stay: Payer: Medicare HMO | Admitting: Certified Registered Nurse Anesthetist

## 2020-03-26 ENCOUNTER — Other Ambulatory Visit: Payer: Self-pay

## 2020-03-26 ENCOUNTER — Encounter: Payer: Self-pay | Admitting: Internal Medicine

## 2020-03-26 ENCOUNTER — Inpatient Hospital Stay: Payer: Medicare HMO

## 2020-03-26 DIAGNOSIS — S7012XA Contusion of left thigh, initial encounter: Secondary | ICD-10-CM

## 2020-03-26 DIAGNOSIS — I10 Essential (primary) hypertension: Secondary | ICD-10-CM | POA: Diagnosis present

## 2020-03-26 DIAGNOSIS — Z886 Allergy status to analgesic agent status: Secondary | ICD-10-CM

## 2020-03-26 DIAGNOSIS — E861 Hypovolemia: Secondary | ICD-10-CM | POA: Diagnosis not present

## 2020-03-26 DIAGNOSIS — M130 Polyarthritis, unspecified: Secondary | ICD-10-CM | POA: Diagnosis present

## 2020-03-26 DIAGNOSIS — Z20822 Contact with and (suspected) exposure to covid-19: Secondary | ICD-10-CM | POA: Diagnosis present

## 2020-03-26 DIAGNOSIS — M96840 Postprocedural hematoma of a musculoskeletal structure following a musculoskeletal system procedure: Secondary | ICD-10-CM | POA: Diagnosis not present

## 2020-03-26 DIAGNOSIS — R1312 Dysphagia, oropharyngeal phase: Secondary | ICD-10-CM | POA: Diagnosis present

## 2020-03-26 DIAGNOSIS — Z885 Allergy status to narcotic agent status: Secondary | ICD-10-CM

## 2020-03-26 DIAGNOSIS — R509 Fever, unspecified: Secondary | ICD-10-CM | POA: Diagnosis not present

## 2020-03-26 DIAGNOSIS — M8949 Other hypertrophic osteoarthropathy, multiple sites: Secondary | ICD-10-CM | POA: Diagnosis not present

## 2020-03-26 DIAGNOSIS — G309 Alzheimer's disease, unspecified: Secondary | ICD-10-CM | POA: Diagnosis not present

## 2020-03-26 DIAGNOSIS — E876 Hypokalemia: Secondary | ICD-10-CM | POA: Diagnosis not present

## 2020-03-26 DIAGNOSIS — R4189 Other symptoms and signs involving cognitive functions and awareness: Secondary | ICD-10-CM | POA: Diagnosis present

## 2020-03-26 DIAGNOSIS — Z6822 Body mass index (BMI) 22.0-22.9, adult: Secondary | ICD-10-CM

## 2020-03-26 DIAGNOSIS — F329 Major depressive disorder, single episode, unspecified: Secondary | ICD-10-CM | POA: Diagnosis present

## 2020-03-26 DIAGNOSIS — S72002A Fracture of unspecified part of neck of left femur, initial encounter for closed fracture: Secondary | ICD-10-CM

## 2020-03-26 DIAGNOSIS — D62 Acute posthemorrhagic anemia: Secondary | ICD-10-CM | POA: Diagnosis not present

## 2020-03-26 DIAGNOSIS — F0280 Dementia in other diseases classified elsewhere without behavioral disturbance: Secondary | ICD-10-CM | POA: Diagnosis not present

## 2020-03-26 DIAGNOSIS — S72012A Unspecified intracapsular fracture of left femur, initial encounter for closed fracture: Principal | ICD-10-CM | POA: Diagnosis present

## 2020-03-26 DIAGNOSIS — Y92099 Unspecified place in other non-institutional residence as the place of occurrence of the external cause: Secondary | ICD-10-CM

## 2020-03-26 DIAGNOSIS — Z96649 Presence of unspecified artificial hip joint: Secondary | ICD-10-CM

## 2020-03-26 DIAGNOSIS — E43 Unspecified severe protein-calorie malnutrition: Secondary | ICD-10-CM | POA: Diagnosis present

## 2020-03-26 DIAGNOSIS — G9341 Metabolic encephalopathy: Secondary | ICD-10-CM | POA: Diagnosis not present

## 2020-03-26 DIAGNOSIS — H3530 Unspecified macular degeneration: Secondary | ICD-10-CM | POA: Diagnosis present

## 2020-03-26 DIAGNOSIS — Z01818 Encounter for other preprocedural examination: Secondary | ICD-10-CM

## 2020-03-26 DIAGNOSIS — M159 Polyosteoarthritis, unspecified: Secondary | ICD-10-CM | POA: Diagnosis present

## 2020-03-26 DIAGNOSIS — Z881 Allergy status to other antibiotic agents status: Secondary | ICD-10-CM | POA: Diagnosis not present

## 2020-03-26 DIAGNOSIS — W1830XA Fall on same level, unspecified, initial encounter: Secondary | ICD-10-CM | POA: Diagnosis present

## 2020-03-26 DIAGNOSIS — W19XXXA Unspecified fall, initial encounter: Secondary | ICD-10-CM

## 2020-03-26 DIAGNOSIS — F0390 Unspecified dementia without behavioral disturbance: Secondary | ICD-10-CM

## 2020-03-26 DIAGNOSIS — Y834 Other reconstructive surgery as the cause of abnormal reaction of the patient, or of later complication, without mention of misadventure at the time of the procedure: Secondary | ICD-10-CM | POA: Diagnosis present

## 2020-03-26 DIAGNOSIS — Z79899 Other long term (current) drug therapy: Secondary | ICD-10-CM | POA: Diagnosis not present

## 2020-03-26 DIAGNOSIS — D509 Iron deficiency anemia, unspecified: Secondary | ICD-10-CM | POA: Diagnosis present

## 2020-03-26 DIAGNOSIS — M150 Primary generalized (osteo)arthritis: Secondary | ICD-10-CM | POA: Diagnosis present

## 2020-03-26 HISTORY — PX: HIP ARTHROPLASTY: SHX981

## 2020-03-26 HISTORY — DX: Essential (primary) hypertension: I10

## 2020-03-26 HISTORY — DX: Unspecified dementia, unspecified severity, without behavioral disturbance, psychotic disturbance, mood disturbance, and anxiety: F03.90

## 2020-03-26 LAB — COMPREHENSIVE METABOLIC PANEL
ALT: 16 U/L (ref 0–44)
AST: 31 U/L (ref 15–41)
Albumin: 4 g/dL (ref 3.5–5.0)
Alkaline Phosphatase: 62 U/L (ref 38–126)
Anion gap: 12 (ref 5–15)
BUN: 23 mg/dL (ref 8–23)
CO2: 26 mmol/L (ref 22–32)
Calcium: 9.3 mg/dL (ref 8.9–10.3)
Chloride: 101 mmol/L (ref 98–111)
Creatinine, Ser: 0.81 mg/dL (ref 0.44–1.00)
GFR calc Af Amer: >60 mL/min (ref >60)
GFR calc non Af Amer: >60 mL/min (ref >60)
Glucose, Bld: 125 mg/dL — ABNORMAL HIGH (ref 70–99)
Potassium: 3.9 mmol/L (ref 3.5–5.1)
Sodium: 139 mmol/L (ref 135–145)
Total Bilirubin: 1 mg/dL (ref 0.3–1.2)
Total Protein: 7.3 g/dL (ref 6.5–8.1)

## 2020-03-26 LAB — PROTIME-INR
INR: 0.9 (ref 0.8–1.2)
Prothrombin Time: 12 seconds (ref 11.4–15.2)

## 2020-03-26 LAB — CBC
HCT: 37.1 % (ref 36.0–46.0)
Hemoglobin: 12.2 g/dL (ref 12.0–15.0)
MCH: 31.8 pg (ref 26.0–34.0)
MCHC: 32.9 g/dL (ref 30.0–36.0)
MCV: 96.6 fL (ref 80.0–100.0)
Platelets: 265 10*3/uL (ref 150–400)
RBC: 3.84 MIL/uL — ABNORMAL LOW (ref 3.87–5.11)
RDW: 13 % (ref 11.5–15.5)
WBC: 10.6 10*3/uL — ABNORMAL HIGH (ref 4.0–10.5)
nRBC: 0 % (ref 0.0–0.2)

## 2020-03-26 LAB — TYPE AND SCREEN
ABO/RH(D): A POS
Antibody Screen: NEGATIVE

## 2020-03-26 LAB — SARS CORONAVIRUS 2 BY RT PCR (HOSPITAL ORDER, PERFORMED IN ~~LOC~~ HOSPITAL LAB): SARS Coronavirus 2: NEGATIVE

## 2020-03-26 LAB — ABO/RH: ABO/RH(D): A POS

## 2020-03-26 LAB — SURGICAL PCR SCREEN
MRSA, PCR: POSITIVE — AB
Staphylococcus aureus: POSITIVE — AB

## 2020-03-26 LAB — TROPONIN I (HIGH SENSITIVITY): Troponin I (High Sensitivity): 14 ng/L (ref <18)

## 2020-03-26 SURGERY — HEMIARTHROPLASTY, HIP, DIRECT ANTERIOR APPROACH, FOR FRACTURE
Anesthesia: Spinal | Site: Hip | Laterality: Left

## 2020-03-26 MED ORDER — MUPIROCIN 2 % EX OINT
1.0000 "application " | TOPICAL_OINTMENT | Freq: Two times a day (BID) | CUTANEOUS | Status: AC
  Administered 2020-03-26 – 2020-03-30 (×10): 1 via NASAL
  Filled 2020-03-26: qty 22

## 2020-03-26 MED ORDER — PROPOFOL 10 MG/ML IV BOLUS
INTRAVENOUS | Status: DC | PRN
  Administered 2020-03-26: 20 mg via INTRAVENOUS
  Administered 2020-03-26 (×3): 10 mg via INTRAVENOUS

## 2020-03-26 MED ORDER — ACETAMINOPHEN 325 MG PO TABS: 325.0000 mg | ORAL_TABLET | Freq: Four times a day (QID) | ORAL | Status: DC | PRN

## 2020-03-26 MED ORDER — LACTATED RINGERS IV SOLN: INTRAVENOUS | Status: DC | PRN

## 2020-03-26 MED ORDER — ZOLPIDEM TARTRATE 5 MG PO TABS: 5.0000 mg | ORAL_TABLET | Freq: Every evening | ORAL | Status: DC | PRN

## 2020-03-26 MED ORDER — FLEET ENEMA 7-19 GM/118ML RE ENEM: 1.0000 | ENEMA | Freq: Once | RECTAL | Status: DC | PRN

## 2020-03-26 MED ORDER — METOCLOPRAMIDE HCL 10 MG PO TABS: 5.0000 mg | ORAL_TABLET | Freq: Three times a day (TID) | ORAL | Status: DC | PRN

## 2020-03-26 MED ORDER — MORPHINE SULFATE (PF) 2 MG/ML IV SOLN
1.0000 mg | INTRAVENOUS | Status: DC | PRN
  Administered 2020-03-26 – 2020-03-27 (×4): 1 mg via INTRAVENOUS
  Filled 2020-03-26 (×3): qty 1

## 2020-03-26 MED ORDER — CLINDAMYCIN PHOSPHATE 600 MG/50ML IV SOLN
600.0000 mg | Freq: Three times a day (TID) | INTRAVENOUS | Status: AC
  Administered 2020-03-27 (×3): 600 mg via INTRAVENOUS
  Filled 2020-03-26 (×3): qty 50

## 2020-03-26 MED ORDER — FENTANYL CITRATE (PF) 100 MCG/2ML IJ SOLN
INTRAMUSCULAR | Status: AC
  Filled 2020-03-26: qty 2

## 2020-03-26 MED ORDER — CHLORHEXIDINE GLUCONATE CLOTH 2 % EX PADS
6.0000 | MEDICATED_PAD | Freq: Every day | CUTANEOUS | Status: AC
  Administered 2020-03-26 – 2020-03-30 (×2): 6 via TOPICAL

## 2020-03-26 MED ORDER — CLINDAMYCIN PHOSPHATE 600 MG/50ML IV SOLN
600.0000 mg | INTRAVENOUS | Status: AC
  Administered 2020-03-26: 600 mg via INTRAVENOUS

## 2020-03-26 MED ORDER — TRANEXAMIC ACID 1000 MG/10ML IV SOLN
INTRAVENOUS | Status: AC
  Filled 2020-03-26: qty 10

## 2020-03-26 MED ORDER — ONDANSETRON HCL 4 MG/2ML IJ SOLN
4.0000 mg | Freq: Four times a day (QID) | INTRAMUSCULAR | Status: DC | PRN
  Administered 2020-03-30: 4 mg via INTRAVENOUS
  Filled 2020-03-26 (×2): qty 2

## 2020-03-26 MED ORDER — PHENOL 1.4 % MT LIQD
1.0000 | OROMUCOSAL | Status: DC | PRN
  Filled 2020-03-26: qty 177

## 2020-03-26 MED ORDER — DOCUSATE SODIUM 100 MG PO CAPS
100.0000 mg | ORAL_CAPSULE | Freq: Two times a day (BID) | ORAL | Status: DC
  Administered 2020-03-27 – 2020-04-01 (×10): 100 mg via ORAL
  Filled 2020-03-26 (×12): qty 1

## 2020-03-26 MED ORDER — PROPOFOL 10 MG/ML IV BOLUS
INTRAVENOUS | Status: AC
  Filled 2020-03-26: qty 20

## 2020-03-26 MED ORDER — BUPIVACAINE LIPOSOME 1.3 % IJ SUSP
INTRAMUSCULAR | Status: AC
  Filled 2020-03-26: qty 20

## 2020-03-26 MED ORDER — ACETAMINOPHEN 500 MG PO TABS
1000.0000 mg | ORAL_TABLET | Freq: Three times a day (TID) | ORAL | Status: DC
  Administered 2020-03-26 – 2020-03-28 (×3): 1000 mg via ORAL
  Filled 2020-03-26 (×4): qty 2

## 2020-03-26 MED ORDER — EPHEDRINE SULFATE 50 MG/ML IJ SOLN
INTRAMUSCULAR | Status: DC | PRN
  Administered 2020-03-26: 5 mg via INTRAVENOUS

## 2020-03-26 MED ORDER — CLINDAMYCIN PHOSPHATE 600 MG/50ML IV SOLN
INTRAVENOUS | Status: AC
  Filled 2020-03-26: qty 50

## 2020-03-26 MED ORDER — DULOXETINE HCL 20 MG PO CPEP
40.0000 mg | ORAL_CAPSULE | Freq: Every day | ORAL | Status: DC
  Administered 2020-03-27 – 2020-04-02 (×6): 40 mg via ORAL
  Filled 2020-03-26 (×8): qty 2

## 2020-03-26 MED ORDER — ONDANSETRON HCL 4 MG PO TABS
4.0000 mg | ORAL_TABLET | Freq: Four times a day (QID) | ORAL | Status: DC | PRN
  Administered 2020-04-02: 4 mg via ORAL
  Filled 2020-03-26: qty 1

## 2020-03-26 MED ORDER — CHLORHEXIDINE GLUCONATE 4 % EX LIQD: 1.0000 "application " | Freq: Once | CUTANEOUS | Status: DC

## 2020-03-26 MED ORDER — BUPIVACAINE HCL (PF) 0.25 % IJ SOLN
INTRAMUSCULAR | Status: AC
  Filled 2020-03-26: qty 30

## 2020-03-26 MED ORDER — NEOMYCIN-POLYMYXIN B GU 40-200000 IR SOLN
Status: AC
  Filled 2020-03-26: qty 20

## 2020-03-26 MED ORDER — METOCLOPRAMIDE HCL 5 MG/ML IJ SOLN: 5.0000 mg | Freq: Three times a day (TID) | INTRAMUSCULAR | Status: DC | PRN

## 2020-03-26 MED ORDER — BUPIVACAINE HCL (PF) 0.5 % IJ SOLN
INTRAMUSCULAR | Status: AC
  Filled 2020-03-26: qty 10

## 2020-03-26 MED ORDER — CEFAZOLIN SODIUM-DEXTROSE 2-4 GM/100ML-% IV SOLN
INTRAVENOUS | Status: AC
  Filled 2020-03-26: qty 100

## 2020-03-26 MED ORDER — BUPIVACAINE-EPINEPHRINE (PF) 0.25% -1:200000 IJ SOLN
INTRAMUSCULAR | Status: DC | PRN
  Administered 2020-03-26: 30 mL via PERINEURAL

## 2020-03-26 MED ORDER — SODIUM CHLORIDE 0.45 % IV SOLN: INTRAVENOUS | Status: DC

## 2020-03-26 MED ORDER — ONDANSETRON HCL 4 MG/2ML IJ SOLN: 4.0000 mg | Freq: Once | INTRAMUSCULAR | Status: DC | PRN

## 2020-03-26 MED ORDER — MENTHOL 3 MG MT LOZG
1.0000 | LOZENGE | OROMUCOSAL | Status: DC | PRN
  Filled 2020-03-26: qty 9

## 2020-03-26 MED ORDER — METHOCARBAMOL 500 MG PO TABS
500.0000 mg | ORAL_TABLET | Freq: Four times a day (QID) | ORAL | Status: DC | PRN
  Administered 2020-03-27: 500 mg via ORAL
  Filled 2020-03-26: qty 1

## 2020-03-26 MED ORDER — VITAMIN D 25 MCG (1000 UNIT) PO TABS
2000.0000 [IU] | ORAL_TABLET | Freq: Every day | ORAL | Status: DC
  Administered 2020-03-27 – 2020-04-02 (×7): 2000 [IU] via ORAL
  Filled 2020-03-26 (×7): qty 2

## 2020-03-26 MED ORDER — FENTANYL CITRATE (PF) 100 MCG/2ML IJ SOLN: 25.0000 ug | INTRAMUSCULAR | Status: DC | PRN

## 2020-03-26 MED ORDER — BISACODYL 10 MG RE SUPP: 10.0000 mg | Freq: Every day | RECTAL | Status: DC | PRN

## 2020-03-26 MED ORDER — ADULT MULTIVITAMIN W/MINERALS CH
1.0000 | ORAL_TABLET | Freq: Every day | ORAL | Status: DC
  Administered 2020-03-27 – 2020-04-02 (×7): 1 via ORAL
  Filled 2020-03-26 (×8): qty 1

## 2020-03-26 MED ORDER — SENNA 8.6 MG PO TABS
1.0000 | ORAL_TABLET | Freq: Every evening | ORAL | Status: DC | PRN
  Filled 2020-03-26 (×2): qty 1

## 2020-03-26 MED ORDER — MIRTAZAPINE 15 MG PO TABS
7.5000 mg | ORAL_TABLET | Freq: Every day | ORAL | Status: DC
  Administered 2020-03-26 – 2020-04-01 (×7): 7.5 mg via ORAL
  Filled 2020-03-26 (×7): qty 1

## 2020-03-26 MED ORDER — SODIUM CHLORIDE 0.9 % IV SOLN
INTRAVENOUS | Status: DC | PRN
  Administered 2020-03-26: 40 ug/min via INTRAVENOUS

## 2020-03-26 MED ORDER — MELATONIN 5 MG PO TABS
2.5000 mg | ORAL_TABLET | Freq: Every day | ORAL | Status: DC
  Administered 2020-03-26 – 2020-03-31 (×5): 2.5 mg via ORAL
  Filled 2020-03-26 (×6): qty 1

## 2020-03-26 MED ORDER — ACETAMINOPHEN 10 MG/ML IV SOLN: 1000.0000 mg | Freq: Once | INTRAVENOUS | Status: DC | PRN

## 2020-03-26 MED ORDER — SODIUM CHLORIDE FLUSH 0.9 % IV SOLN
INTRAVENOUS | Status: AC
  Filled 2020-03-26: qty 40

## 2020-03-26 MED ORDER — LOPERAMIDE HCL 2 MG PO CAPS
2.0000 mg | ORAL_CAPSULE | Freq: Four times a day (QID) | ORAL | Status: DC | PRN
  Filled 2020-03-26: qty 2

## 2020-03-26 MED ORDER — HYDROCODONE-ACETAMINOPHEN 5-325 MG PO TABS
1.0000 | ORAL_TABLET | Freq: Four times a day (QID) | ORAL | Status: DC | PRN
  Administered 2020-03-28 – 2020-04-02 (×5): 1 via ORAL
  Filled 2020-03-26 (×5): qty 1

## 2020-03-26 MED ORDER — FENTANYL CITRATE (PF) 100 MCG/2ML IJ SOLN
INTRAMUSCULAR | Status: DC | PRN
Start: 2020-03-26 — End: 2020-03-26
  Administered 2020-03-26 (×2): 25 ug via INTRAVENOUS
  Administered 2020-03-26: 50 ug via INTRAVENOUS

## 2020-03-26 MED ORDER — PHENYLEPHRINE HCL (PRESSORS) 10 MG/ML IV SOLN
INTRAVENOUS | Status: DC | PRN
  Administered 2020-03-26 (×2): 100 ug via INTRAVENOUS

## 2020-03-26 MED ORDER — ALUM & MAG HYDROXIDE-SIMETH 200-200-20 MG/5ML PO SUSP: 30.0000 mL | ORAL | Status: DC | PRN

## 2020-03-26 MED ORDER — CEFAZOLIN SODIUM-DEXTROSE 2-4 GM/100ML-% IV SOLN
2.0000 g | Freq: Three times a day (TID) | INTRAVENOUS | Status: AC
  Administered 2020-03-27 (×2): 2 g via INTRAVENOUS
  Filled 2020-03-26 (×2): qty 100

## 2020-03-26 MED ORDER — ENOXAPARIN SODIUM 30 MG/0.3ML ~~LOC~~ SOLN: 30.0000 mg | SUBCUTANEOUS | Status: DC

## 2020-03-26 MED ORDER — ACETAMINOPHEN 10 MG/ML IV SOLN
INTRAVENOUS | Status: DC | PRN
  Administered 2020-03-26: 1000 mg via INTRAVENOUS

## 2020-03-26 MED ORDER — CEFAZOLIN SODIUM-DEXTROSE 2-4 GM/100ML-% IV SOLN
2.0000 g | INTRAVENOUS | Status: AC
  Administered 2020-03-26: 2 g via INTRAVENOUS

## 2020-03-26 MED ORDER — SODIUM CHLORIDE 0.9 % IV SOLN: INTRAVENOUS | Status: DC

## 2020-03-26 MED ORDER — METHOCARBAMOL 1000 MG/10ML IJ SOLN
500.0000 mg | Freq: Four times a day (QID) | INTRAVENOUS | Status: DC | PRN
  Filled 2020-03-26 (×2): qty 5

## 2020-03-26 MED ORDER — FERROUS SULFATE 325 (65 FE) MG PO TABS
325.0000 mg | ORAL_TABLET | Freq: Every day | ORAL | Status: DC
  Administered 2020-03-27 – 2020-03-30 (×3): 325 mg via ORAL
  Filled 2020-03-26 (×3): qty 1

## 2020-03-26 MED ORDER — EPINEPHRINE PF 1 MG/ML IJ SOLN
INTRAMUSCULAR | Status: AC
  Filled 2020-03-26: qty 1

## 2020-03-26 MED ORDER — HYDROCODONE-ACETAMINOPHEN 5-325 MG PO TABS
1.0000 | ORAL_TABLET | Freq: Four times a day (QID) | ORAL | Status: DC | PRN
  Filled 2020-03-26: qty 1

## 2020-03-26 MED ORDER — MORPHINE SULFATE (PF) 2 MG/ML IV SOLN: 0.5000 mg | INTRAVENOUS | Status: DC | PRN

## 2020-03-26 MED ORDER — MORPHINE SULFATE (PF) 2 MG/ML IV SOLN
2.0000 mg | Freq: Once | INTRAVENOUS | Status: AC
  Administered 2020-03-26: 2 mg via INTRAVENOUS
  Filled 2020-03-26: qty 1

## 2020-03-26 MED ORDER — MORPHINE SULFATE (PF) 2 MG/ML IV SOLN
1.0000 mg | INTRAVENOUS | Status: DC | PRN
  Administered 2020-03-28: 1 mg via INTRAVENOUS
  Filled 2020-03-26 (×4): qty 1

## 2020-03-26 MED ORDER — CEFAZOLIN SODIUM-DEXTROSE 2-4 GM/100ML-% IV SOLN
2.0000 g | Freq: Three times a day (TID) | INTRAVENOUS | Status: DC
  Filled 2020-03-26 (×3): qty 100

## 2020-03-26 MED ORDER — BUPIVACAINE HCL (PF) 0.5 % IJ SOLN
INTRAMUSCULAR | Status: DC | PRN
  Administered 2020-03-26: 2.6 mL via INTRATHECAL

## 2020-03-26 MED ORDER — ACETAMINOPHEN 10 MG/ML IV SOLN
INTRAVENOUS | Status: AC
  Filled 2020-03-26: qty 100

## 2020-03-26 SURGICAL SUPPLY — 56 items
BLADE DEBAKEY 8.0 (BLADE) ×2 IMPLANT
BLADE DEBAKEY 8.0MM (BLADE) ×1
BLADE SAGITTAL WIDE XTHICK NO (BLADE) ×3 IMPLANT
BLADE SURG SZ10 CARB STEEL (BLADE) ×3 IMPLANT
CANISTER SUCT 1200ML W/VALVE (MISCELLANEOUS) ×9 IMPLANT
CHLORAPREP W/TINT 26 (MISCELLANEOUS) ×6 IMPLANT
COVER BACK TABLE REUSABLE LG (DRAPES) ×3 IMPLANT
COVER WAND RF STERILE (DRAPES) ×3 IMPLANT
DRAPE INCISE IOBAN 66X60 STRL (DRAPES) ×6 IMPLANT
DRSG AQUACEL AG ADV 3.5X10 (GAUZE/BANDAGES/DRESSINGS) ×1 IMPLANT
DRSG AQUACEL AG ADV 3.5X14 (GAUZE/BANDAGES/DRESSINGS) ×3 IMPLANT
ELECT BLADE 6.5 EXT (BLADE) ×3 IMPLANT
ELECT CAUTERY BLADE 6.4 (BLADE) ×3 IMPLANT
ELECT REM PT RETURN 9FT ADLT (ELECTROSURGICAL) ×3
ELECTRODE REM PT RTRN 9FT ADLT (ELECTROSURGICAL) ×1 IMPLANT
GAUZE SPONGE 4X4 12PLY STRL (GAUZE/BANDAGES/DRESSINGS) ×1 IMPLANT
GAUZE XEROFORM 1X8 LF (GAUZE/BANDAGES/DRESSINGS) ×4 IMPLANT
GLOVE INDICATOR 8.0 STRL GRN (GLOVE) ×3 IMPLANT
GLOVE SURG ORTHO 8.5 STRL (GLOVE) ×3 IMPLANT
GOWN STRL REUS W/ TWL LRG LVL3 (GOWN DISPOSABLE) ×2 IMPLANT
GOWN STRL REUS W/TWL LRG LVL3 (GOWN DISPOSABLE) ×4
GOWN STRL REUS W/TWL LRG LVL4 (GOWN DISPOSABLE) ×3 IMPLANT
HEAD MODULAR ENDO (Orthopedic Implant) ×2 IMPLANT
HEAD UNPLR 48XMDLR STRL HIP (Orthopedic Implant) IMPLANT
HEMOVAC 400CC 10FR (MISCELLANEOUS) ×1 IMPLANT
IV NS 1000ML (IV SOLUTION) ×2
IV NS 1000ML BAXH (IV SOLUTION) ×1 IMPLANT
KIT TURNOVER KIT A (KITS) ×3 IMPLANT
NDL FILTER BLUNT 18X1 1/2 (NEEDLE) ×1 IMPLANT
NDL MAYO CATGUT SZ4 TPR NDL (NEEDLE) ×1 IMPLANT
NDL SPNL 18GX3.5 QUINCKE PK (NEEDLE) ×2 IMPLANT
NEEDLE FILTER BLUNT 18X 1/2SAF (NEEDLE) ×2
NEEDLE FILTER BLUNT 18X1 1/2 (NEEDLE) ×1 IMPLANT
NEEDLE MAYO CATGUT SZ4 (NEEDLE) ×3 IMPLANT
NEEDLE SPNL 18GX3.5 QUINCKE PK (NEEDLE) ×6 IMPLANT
NS IRRIG 1000ML POUR BTL (IV SOLUTION) ×3 IMPLANT
PACK HIP PROSTHESIS (MISCELLANEOUS) ×3 IMPLANT
PAD ABD DERMACEA PRESS 5X9 (GAUZE/BANDAGES/DRESSINGS) ×2 IMPLANT
PILLOW ABDUCTION FOAM SM (MISCELLANEOUS) ×2 IMPLANT
PULSAVAC PLUS IRRIG FAN TIP (DISPOSABLE) ×3
SLEEVE UNITRAX V40 STD (Orthopedic Implant) ×2 IMPLANT
SOL PREP PVP 2OZ (MISCELLANEOUS) ×3
SOLUTION PREP PVP 2OZ (MISCELLANEOUS) ×1 IMPLANT
STAPLER SKIN PROX 35W (STAPLE) ×3 IMPLANT
STEM HIP 127 DEG (Stem) ×2 IMPLANT
SUT DVC 2 QUILL PDO  T11 36X36 (SUTURE) ×4
SUT DVC 2 QUILL PDO T11 36X36 (SUTURE) ×2 IMPLANT
SUT QUILL PDO 0 36 36 VIOLET (SUTURE) ×3 IMPLANT
SUT TICRON 2-0 30IN 311381 (SUTURE) ×15 IMPLANT
SYR 10ML LL (SYRINGE) ×3 IMPLANT
SYR 30ML LL (SYRINGE) ×3 IMPLANT
SYR 50ML LL SCALE MARK (SYRINGE) ×3 IMPLANT
TAPE MICROFOAM 4IN (TAPE) ×1 IMPLANT
TIP FAN IRRIG PULSAVAC PLUS (DISPOSABLE) ×1 IMPLANT
TUBE SUCT KAM VAC (TUBING) ×3 IMPLANT
WATER STERILE IRR 1000ML POUR (IV SOLUTION) ×3 IMPLANT

## 2020-03-26 NOTE — Progress Notes (Signed)
Initial Nutrition Assessment  DOCUMENTATION CODES:   Severe malnutrition in context of social or environmental circumstances  INTERVENTION:  Once diet is advanced recommend: -Ensure Enlive po BID, each supplement provides 350 kcal and 20 grams of protein -Magic cup TID with meals, each supplement provides 290 kcal and 9 grams of protein  Continue daily MVI  NUTRITION DIAGNOSIS:   Severe Malnutrition related to social / environmental circumstances (dementia, advanced age) as evidenced by severe fat depletion, severe muscle depletion.  GOAL:   Patient will meet greater than or equal to 90% of their needs  MONITOR:   Diet advancement, PO intake, Supplement acceptance, Labs, Weight trends, Skin, I & O's  REASON FOR ASSESSMENT:   Consult Hip fracture protocol  ASSESSMENT:   86 year old female with PMHx of dementia, HTN admitted after fall with left displaced femoral neck fracture.   Met with patient at bedside. She is unable to provide any history in setting of dementia. Per review of chart patient has legal guardian Leroy Libman and resides at [facility] care home.  Spoke with owner of the group home Jobe Marker over the phone. She reports patient is on a regular diet with thin liquids. She eats 3 meals per day and snacks between meals. She typically eats 75-80% of her meals but occasionally will refuse a meal or eat less. She drinks one bottle of Ensure each morning.  Ms. Luz Lex reports patient's UBW is between 120-130 lbs but that there has not been a recent weight for her. Current weight documented in chart is 63.5 kg (140 lbs) but unsure if this is accurate as patient appears to weigh less.  Medications reviewed and include: vitamin D3 2000 untis daily, Remeron 7.5 mg QHS, MVI daily, NS at 75 mL/hr.  Labs reviewed.  NUTRITION - FOCUSED PHYSICAL EXAM:    Most Recent Value  Orbital Region Severe depletion  Upper Arm Region Severe depletion  Thoracic and Lumbar Region  Severe depletion  Buccal Region Severe depletion  Temple Region Severe depletion  Clavicle Bone Region Severe depletion  Clavicle and Acromion Bone Region Severe depletion  Scapular Bone Region Unable to assess  Dorsal Hand Severe depletion  Patellar Region Unable to assess  Anterior Thigh Region Unable to assess  Posterior Calf Region Unable to assess  Edema (RD Assessment) None  Hair Reviewed  Eyes Unable to assess  Mouth Unable to assess  Skin Reviewed  Nails Reviewed     Diet Order:   Diet Order            Diet NPO time specified  Diet effective now                EDUCATION NEEDS:   No education needs have been identified at this time  Skin:  Skin Assessment: Reviewed RN Assessment  Last BM:  Unknown  Height:   Ht Readings from Last 1 Encounters:  03/26/20 _0  (1.676 m)   Weight:   Wt Readings from Last 1 Encounters:  03/26/20 63.5 kg   BMI:  Body mass index is 22.6 kg/m.  Estimated Nutritional Needs:   Kcal:  1600-1800  Protein:  80-90 grams  Fluid:  1.6 L/day  Jacklynn Barnacle, MS, RD, LDN Pager number available on Amion

## 2020-03-26 NOTE — ED Provider Notes (Signed)
[facility] Emergency Department Provider Note  ____________________________________________   First MD Initiated Contact with Patient 03/26/20 [phone]     (approximate)  I have reviewed the triage vital signs and the nursing notes.  Level 5 caveat history review of system limited secondary to dementia HISTORY  Chief Complaint Fall   HPI Kayla Fowler is a 86 y.o. female presents to the emergency department via EMS following an unwitnessed fall at the assisted living facility where she resides at 7:00 PM yesterday.  Patient does admit to left leg pain.       Past Medical History:  Diagnosis Date   Dementia [facility](HCC) hypertension   Hypertension     Patient Active Problem List   Diagnosis Date Noted   Left displaced femoral neck fracture (HCC) 03/26/2020   Dementia without behavioral disturbance (HCC) 03/26/2020   Accidental fall 03/26/2020   Preoperative clearance 03/26/2020   Essential hypertension 03/26/2020   Primary osteoarthritis involving multiple joints 12/29/2018   Vascular dementia without behavioral disturbance (HCC) 09/23/2016    No past surgical history on file.  Prior to Admission medications   Medication Sig Start Date End Date Taking? Authorizing Provider  acetaminophen (TYLENOL) 500 MG tablet Take 1,000 mg by mouth every 8 (eight) hours.   Yes [provider]  Cholecalciferol 50 MCG (2000 UT) TABS Take 2,000 Units by mouth daily.   Yes [provider]  diclofenac sodium (VOLTAREN) 1 % GEL Apply 2 g topically 2 (two) times daily. Apply to right shoulder and neck   Yes [provider]  DULoxetine (CYMBALTA) 20 MG capsule Take 40 mg by mouth daily.    Yes [provider]  loperamide (IMODIUM A-D) 2 MG tablet Take 2-4 mg by mouth 4 (four) times daily as needed for diarrhea or loose stools. Take 2 tablets (4 mg) when diarrhea starts, then 1 tablet (2 mg) for additional doses   Yes [provider]  Melatonin 3 MG TABS Take 3 mg by mouth at bedtime.    Yes [provider]  Menthol (HALLS COUGH DROPS) 5.8 MG LOZG Use as directed 1 lozenge in the mouth or throat every 3 (three) hours as needed (sore throat).   Yes [provider]  mirtazapine (REMERON) 15 MG tablet Take 7.5 mg by mouth at bedtime.   Yes [provider]  Multiple Vitamins-Minerals (THEREMS-M) TABS Take 1 tablet by mouth daily.   Yes [provider]  senna (SENOKOT) 8.6 MG TABS tablet Take 1 tablet by mouth at bedtime as needed for mild constipation.   Yes [provider]  traZODone (DESYREL) 100 MG tablet Take 100 mg by mouth at bedtime.   Yes [provider]    Allergies Ibuprofen, Neomycin, Percocet [oxycodone-acetaminophen], and Triple antibiotic [bacitracin-neomycin-polymyxin]  No family history on file.  Social History Social History   Tobacco Use   Smoking status: Never Smoker   Smokeless tobacco: Never Used  Substance Use Topics   Alcohol use: Never   Drug use: Never    Review of Systems Constitutional: No fever/chills Eyes: No visual changes. ENT: No sore throat. Cardiovascular: Denies chest pain. Respiratory: Denies shortness of breath. Gastrointestinal: No abdominal pain.  No nausea, no vomiting.  No diarrhea.  No constipation. Genitourinary: Negative for dysuria. Musculoskeletal: Positive for left leg pain negative for neck pain.  Negative for back pain. Integumentary: Negative for rash. Neurological: Negative for headaches, focal weakness or numbness.  ____________________________________________   PHYSICAL EXAM:  VITAL SIGNS:  ED Triage Vitals  Enc Vitals Group     BP 03/26/20 0310 (!) 146/81     Pulse Rate 03/26/20 0310 91     Resp 03/26/20 0310 16     Temp 03/26/20 0310 98.9 F (37.2 C)     Temp Source 03/26/20 0310 Oral     SpO2 03/26/20 0310 95 %     Weight 03/26/20 0311 63.5 kg (140 lb)     Height 03/26/20  0311 1.676 m (5\' 6" )     Head Circumference --      Peak Flow --      Pain Score --      Pain Loc --      Pain Edu? --      Excl. in GC? --     Constitutional: Alert and oriented.  Apparent discomfort Eyes: Conjunctivae are normal.  Head: Atraumatic. Mouth/Throat: Patient is wearing a mask. Neck: No stridor.  No meningeal signs.   Cardiovascular: Normal rate, regular rhythm. Good peripheral circulation. Grossly normal heart sounds. Respiratory: Normal respiratory effort.  No retractions. Gastrointestinal: Soft and nontender. No distention.  Musculoskeletal: Left leg shortened and external rotation Neurologic:  Normal speech and language. No gross focal neurologic deficits are appreciated.  Skin:  Skin is warm, dry and intact. Psychiatric: Mood and affect are normal. Speech and behavior are normal.  ____________________________________________   LABS (all labs ordered are listed, but only abnormal results are displayed)  Labs Reviewed  CBC - Abnormal; Notable for the following components:      Result Value   WBC 10.6 (*)    RBC 3.84 (*)    All other components within normal limits  COMPREHENSIVE METABOLIC PANEL - Abnormal; Notable for the following components:   Glucose, Bld 125 (*)    All other components within normal limits  SARS CORONAVIRUS 2 BY RT PCR (HOSPITAL ORDER, PERFORMED IN [facility])  PROTIME-INR  URINALYSIS, ROUTINE W REFLEX MICROSCOPIC  TYPE AND SCREEN  TROPONIN I (HIGH SENSITIVITY)   ____________________________________________  EKG  ED ECG REPORT I, [place] N Cyrena Kuchenbecker, the attending physician, personally viewed and interpreted this ECG.   Date: 03/26/2020  EKG Time: 5:30 AM  Rate: 79  Rhythm: Normal sinus rhythm with premature ventricular contraction  Axis: Normal  Intervals: Normal  ST&T Change: None   RADIOLOGY I, [place] N Nevaen Tredway, personally viewed and evaluated these images (plain radiographs) as part of my medical decision  making, as well as reviewing the written report by the radiologist.  ED MD interpretation: Left femoral neck fracture with varus angulation on left hip x-ray per radiologist.  No acute cardiopulmonary abnormality noted on chest x-ray.  Official radiology report(s): DG Chest 1 View  Result Date: 03/26/2020 CLINICAL DATA:  86 year old female with fall and left knee pain. EXAM: CHEST  1 VIEW COMPARISON:  Chest radiograph dated 10/18/2019 and CT dated 10/18/2019 FINDINGS: No focal consolidation, pleural effusion, or pneumothorax. There is chronic interstitial coarsening and bronchitic changes. Mild cardiomegaly. Atherosclerotic calcification of the aorta. Osteopenia with degenerative changes of the spine. T11 compression fracture as seen previously. No acute osseous pathology. Left shoulder arthroplasty. IMPRESSION: 1. No acute cardiopulmonary process. 2. Mild cardiomegaly. Electronically Signed   By: 10/20/2019 M.D.   On: 03/26/2020 03:46   CT Head Wo Contrast  Result Date: 03/26/2020 CLINICAL DATA:  Unwitnessed fall. EXAM: CT HEAD WITHOUT CONTRAST CT CERVICAL SPINE WITHOUT CONTRAST TECHNIQUE: Multidetector CT imaging of the head and cervical spine was performed following the  standard protocol without intravenous contrast. Multiplanar CT image reconstructions of the cervical spine were also generated. COMPARISON:  CT head and cervical spine 05/13/2019 FINDINGS: CT HEAD FINDINGS Brain: Atrophy and white matter changes are stable. Remote infarct of the left parietal lobe is stable. Chronic ischemic changes are evident in the thalami bilaterally and extending into the brainstem. The ventricles are proportionate to the degree of atrophy. No significant extraaxial fluid collection is present. The brainstem and cerebellum are otherwise within normal limits. Vascular: Atherosclerotic calcifications within the cavernous internal carotid arteries and vertebral arteries at the dural margin bilaterally are  stable. No hyperdense vessel is present. Skull: Calvarium is intact. No focal lytic or blastic lesions are present. No significant extracranial soft tissue lesion is present. Sinuses/Orbits: The paranasal sinuses and mastoid air cells are clear. Bilateral lens replacements are noted. Globes and orbits are otherwise unremarkable. CT CERVICAL SPINE FINDINGS Alignment: Grade 1 degenerative anterolisthesis at C3-4, C4-5, C7-T1, and T1-2 is stable. Skull base and vertebrae: Degenerative changes are present C1-2. Craniocervical junction is otherwise normal. No acute abnormalities are present. Soft tissues and spinal canal: No prevertebral fluid or swelling. No visible canal hematoma. Disc levels: Right foraminal narrowing at C3-4 C4-5 and left foraminal narrowing at C5-6 is stable. No new or significantly progressive disc disease is present. Central canal stenosis is most severe at C5-6, stable Upper chest: Scarring at the right lung apex is stable. Thoracic inlet is within normal limits. IMPRESSION: 1. No acute trauma to the head or cervical spine. 2. Stable atrophy and white matter disease. 3. Stable multilevel degenerative changes of the cervical spine as described. Electronically Signed   By: Marin Roberts M.D.   On: 03/26/2020 04:34   CT Cervical Spine Wo Contrast  Result Date: 03/26/2020 CLINICAL DATA:  Unwitnessed fall. EXAM: CT HEAD WITHOUT CONTRAST CT CERVICAL SPINE WITHOUT CONTRAST TECHNIQUE: Multidetector CT imaging of the head and cervical spine was performed following the standard protocol without intravenous contrast. Multiplanar CT image reconstructions of the cervical spine were also generated. COMPARISON:  CT head and cervical spine 05/13/2019 FINDINGS: CT HEAD FINDINGS Brain: Atrophy and white matter changes are stable. Remote infarct of the left parietal lobe is stable. Chronic ischemic changes are evident in the thalami bilaterally and extending into the brainstem. The ventricles are  proportionate to the degree of atrophy. No significant extraaxial fluid collection is present. The brainstem and cerebellum are otherwise within normal limits. Vascular: Atherosclerotic calcifications within the cavernous internal carotid arteries and vertebral arteries at the dural margin bilaterally are stable. No hyperdense vessel is present. Skull: Calvarium is intact. No focal lytic or blastic lesions are present. No significant extracranial soft tissue lesion is present. Sinuses/Orbits: The paranasal sinuses and mastoid air cells are clear. Bilateral lens replacements are noted. Globes and orbits are otherwise unremarkable. CT CERVICAL SPINE FINDINGS Alignment: Grade 1 degenerative anterolisthesis at C3-4, C4-5, C7-T1, and T1-2 is stable. Skull base and vertebrae: Degenerative changes are present C1-2. Craniocervical junction is otherwise normal. No acute abnormalities are present. Soft tissues and spinal canal: No prevertebral fluid or swelling. No visible canal hematoma. Disc levels: Right foraminal narrowing at C3-4 C4-5 and left foraminal narrowing at C5-6 is stable. No new or significantly progressive disc disease is present. Central canal stenosis is most severe at C5-6, stable Upper chest: Scarring at the right lung apex is stable. Thoracic inlet is within normal limits. IMPRESSION: 1. No acute trauma to the head or cervical spine. 2. Stable atrophy and white matter  disease. 3. Stable multilevel degenerative changes of the cervical spine as described. Electronically Signed   By: Marin Roberts M.D.   On: 03/26/2020 04:34   DG Hip Unilat W or Wo Pelvis 2-3 Views Left  Result Date: 03/26/2020 CLINICAL DATA:  Fall, left hip pain EXAM: DG HIP (WITH OR WITHOUT PELVIS) 2-3V LEFT COMPARISON:  None. FINDINGS: Single view radiograph pelvis and two view radiograph left hip demonstrates a transcervical left femoral neck fracture with marked varus angulation of the distal fracture fragment. Femoral head  appears seated within the left acetabulum but demonstrates marked internal rotation. Left hip joint space appears preserved. Pelvis and sacrum appear intact. Right hip bipolar hemiarthroplasty has been performed. Degenerative changes are seen within the lumbar spine. IMPRESSION: Transcervical left femoral neck fracture with marked varus angulation. Electronically Signed   By: Helyn Numbers MD   On: 03/26/2020 03:48   DG Femur Portable Min 2 Views Left  Result Date: 03/26/2020 CLINICAL DATA:  Fall, left hip pain EXAM: LEFT FEMUR PORTABLE 2 VIEWS COMPARISON:  None. FINDINGS: Two view radiograph of the left femur demonstrates a transcervical femoral neck fracture of the left hip with marked varus angulation of the distal fracture fragment. The femoral head is still seated within the left acetabulum and the left hip joint space appears preserved. No other fracture or dislocation identified. IMPRESSION: Transcervical left femoral neck fracture with marked varus angulation. Electronically Signed   By: Helyn Numbers MD   On: 03/26/2020 03:47  No acute trauma to the head or cervical spine noted on CT.   Procedures   ____________________________________________   INITIAL IMPRESSION / MDM / ASSESSMENT AND PLAN / ED COURSE  As part of my medical decision making, I reviewed the following data within the electronic MEDICAL RECORD NUMBER   86 year old female presented with above-stated history and physical exam concerning for left hip fracture which was confirmed on x-ray.  Patient given 2 mg IV morphine in the emergency department.  Patient subsequently discussed with Dr. Hyacinth Meeker orthopedic surgeon and Dr. Para March hospitalist for admission for further evaluation and management.      ____________________________________________  FINAL CLINICAL IMPRESSION(S) / ED DIAGNOSES  Final diagnoses:  Left displaced femoral neck fracture (HCC)     MEDICATIONS GIVEN DURING THIS VISIT:  Medications    HYDROcodone-acetaminophen (NORCO/VICODIN) 5-325 MG per tablet 1-2 tablet (has no administration in time range)  morphine 2 MG/ML injection 0.5 mg (has no administration in time range)  0.9 %  sodium chloride infusion ( Intravenous New Bag/Given 03/26/20 0552)  0.9 %  sodium chloride infusion (has no administration in time range)  ceFAZolin (ANCEF) IVPB 2g/100 mL premix (has no administration in time range)  chlorhexidine (HIBICLENS) 4 % liquid 1 application (has no administration in time range)  clindamycin (CLEOCIN) IVPB 600 mg (has no administration in time range)  morphine 2 MG/ML injection 1 mg (has no administration in time range)  morphine 2 MG/ML injection 2 mg (2 mg Intravenous Given 03/26/20 0346)     ED Discharge Orders    None      *Please note:  Gay Moncivais was evaluated in Emergency Department on 03/26/2020 for the symptoms described in the history of present illness. She was evaluated in the context of the global COVID-19 pandemic, which necessitated consideration that the patient might be at risk for infection with the SARS-CoV-2 virus that causes COVID-19. Institutional protocols and algorithms that pertain to the evaluation of patients at risk for COVID-19 are in a  state of rapid change based on information released by regulatory bodies including the CDC and federal and state organizations. These policies and algorithms were followed during the patient's care in the ED.  Some ED evaluations and interventions may be delayed as a result of limited staffing during and after the pandemic.*  Note:  This document was prepared using Dragon voice recognition software and may include unintentional dictation errors.   Darci Current, MD 03/26/20 [phone]

## 2020-03-26 NOTE — Transfer of Care (Signed)
Immediate Anesthesia Transfer of Care Note  Patient: Kayla Fowler  Procedure(s) Performed: ARTHROPLASTY BIPOLAR HIP (HEMIARTHROPLASTY) (Left Hip)  Patient Location: PACU  Anesthesia Type:Spinal  Level of Consciousness: sedated  Airway & Oxygen Therapy: Patient connected to face mask oxygen  Post-op Assessment: Report given to RN and Post -op Vital signs reviewed and stable  Post vital signs: Reviewed and stable  Last Vitals:  Vitals Value Taken Time  BP 109/54 03/26/20 1914  Temp 36.7 C 03/26/20 1914  Pulse 69 03/26/20 1917  Resp 18 03/26/20 1918  SpO2 96 % 03/26/20 1917  Vitals shown include unvalidated device data.  Last Pain:  Vitals:   03/26/20 1914  TempSrc:   PainSc: Asleep      Patients Stated Pain Goal: 0 (03/26/20 1240)  Complications: No complications documented.

## 2020-03-26 NOTE — Anesthesia Procedure Notes (Signed)
Spinal  Start time: 03/26/2020 5:30 PM Staffing Performed: anesthesiologist  Anesthesiologist: Corinda Gubler, MD Resident/CRNA: Jaye Beagle, CRNA Preanesthetic Checklist Completed: patient identified, IV checked, site marked, risks and benefits discussed, surgical consent, monitors and equipment checked, pre-op evaluation and timeout performed Spinal Block Patient position: right lateral decubitus Prep: ChloraPrep Patient monitoring: heart rate, continuous pulse ox and blood pressure Approach: midline Location: L2-3 Injection technique: single-shot Needle Needle type: Quincke  Needle gauge: 22 G Additional Notes Negative heme, negative paresthesia, no pain with injection

## 2020-03-26 NOTE — ED Notes (Signed)
Spoke with dr. Para March regarding 0600 cholorhexidine bath and antibiotic orders. Per dr. Para March may wait until ED RN aware of what time pt is going to OR.

## 2020-03-26 NOTE — Anesthesia Postprocedure Evaluation (Signed)
Anesthesia Post Note  Patient: Cheron Coryell  Procedure(s) Performed: ARTHROPLASTY BIPOLAR HIP (HEMIARTHROPLASTY) (Left Hip)  Patient location during evaluation: PACU Anesthesia Type: Spinal Level of consciousness: confused and lethargic (as per preop neurologic status) Pain management: pain level controlled Vital Signs Assessment: post-procedure vital signs reviewed and stable Respiratory status: spontaneous breathing, respiratory function stable and patient connected to nasal cannula oxygen Cardiovascular status: blood pressure returned to baseline and stable Postop Assessment: no headache, no backache and no apparent nausea or vomiting Anesthetic complications: no   No complications documented.   Last Vitals:  Vitals:   03/26/20 1944 03/26/20 1959  BP: (!) 107/59 (!) 118/57  Pulse: 66 61  Resp: 17 18  Temp:  36.9 C  SpO2: 95% 96%    Last Pain:  Vitals:   03/26/20 1914  TempSrc:   PainSc: Asleep                 Corinda Gubler

## 2020-03-26 NOTE — ED Notes (Signed)
Pts [facility] DSS legal guardian called to obtain verbal phone consent for left hip surgery, verified by this RN and April B, Charity fundraiser.

## 2020-03-26 NOTE — H&P (Signed)
History and Physical    Kayla Fowler ZOX:[id number] DOB: 07-09-1927 DOA: 03/26/2020  PCP: Theodosia BlenderGilliam, Meredith Ashley, MD   Patient coming from: Assisted living  I have personally briefly reviewed patient's old medical records in [facility]  Chief Complaint: Fall  HPI: Kayla Fowler is a 86 y.o. female with medical history significant for dementia who presents following an unwitnessed fall with complaints of pain to the left hip.  History limited due to dementia ED Course: On arrival vital signs within normal limits.  Blood work unremarkable.  Troponin 14.  Patient had extensive imaging studies including CT head, C-spine, hip and chest.  X-rays of hip showed transcervical left femoral neck fracture with marked varus angulation.  The emergency room provider spoke with orthopedist Dr. Hyacinth MeekerMiller.  Hospitalist consulted for admission.  Review of Systems: Unable to perform due to dementia Past Medical History:  Diagnosis Date  . Dementia (HCC) hypertension  . Hypertension     Surgical history: Unable to obtain due to dementia   reports that she has never smoked. She has never used smokeless tobacco. She reports that she does not drink alcohol and does not use drugs.  Allergies  Allergen Reactions  . Ibuprofen Other (See Comments)    Reaction: unknown  . Neomycin Other (See Comments)    Reaction: unknown  . Percocet [Oxycodone-Acetaminophen] Other (See Comments)    Reaction: unknown  . Triple Antibiotic [Bacitracin-Neomycin-Polymyxin] Other (See Comments)    Reaction: unknown    Family history: Unable to obtain due to dementia   Prior to Admission medications   Medication Sig Start Date End Date Taking? Authorizing Provider  acetaminophen (TYLENOL) 500 MG tablet Take 1,000 mg by mouth every 8 (eight) hours.   Yes [provider]  Cholecalciferol 50 MCG (2000 UT) TABS Take 2,000 Units by mouth daily.   Yes [provider]  diclofenac sodium (VOLTAREN) 1 % GEL  Apply 2 g topically 2 (two) times daily. Apply to right shoulder and neck   Yes [provider]  DULoxetine (CYMBALTA) 20 MG capsule Take 40 mg by mouth daily.    Yes [provider]  loperamide (IMODIUM A-D) 2 MG tablet Take 2-4 mg by mouth 4 (four) times daily as needed for diarrhea or loose stools. Take 2 tablets (4 mg) when diarrhea starts, then 1 tablet (2 mg) for additional doses   Yes [provider]  Melatonin 3 MG TABS Take 3 mg by mouth at bedtime.    Yes [provider]  Menthol (HALLS COUGH DROPS) 5.8 MG LOZG Use as directed 1 lozenge in the mouth or throat every 3 (three) hours as needed (sore throat).   Yes [provider]  mirtazapine (REMERON) 15 MG tablet Take 7.5 mg by mouth at bedtime.   Yes [provider]  Multiple Vitamins-Minerals (THEREMS-M) TABS Take 1 tablet by mouth daily.   Yes [provider]  senna (SENOKOT) 8.6 MG TABS tablet Take 1 tablet by mouth at bedtime as needed for mild constipation.   Yes [provider]  traZODone (DESYREL) 100 MG tablet Take 100 mg by mouth at bedtime.   Yes [provider]    Physical Exam: Vitals:   03/26/20 0310 03/26/20 0311  BP: (!) 146/81   Pulse: 91   Resp: 16   Temp: 98.9 F (37.2 C)   TempSrc: Oral   SpO2: 95%   Weight:  63.5 kg  Height:  5\' 6"  (1.676 m)     Vitals:  03/26/20 0310 03/26/20 0311  BP: (!) 146/81   Pulse: 91   Resp: 16   Temp: 98.9 F (37.2 C)   TempSrc: Oral   SpO2: 95%   Weight:  63.5 kg  Height:  5\' 6"  (1.676 m)      Constitutional:  Alert and in no distress HEENT:      Head: Normocephalic and atraumatic.         Eyes: PERLA, EOMI, Conjunctivae are normal. Sclera is non-icteric.       Mouth/Throat: Mucous membranes are moist.       Neck: Supple with no signs of meningismus. Cardiovascular: Regular rate and rhythm. No murmurs, gallops, or rubs. 2+ symmetrical distal pulses are present . No JVD. No LE  edema Respiratory: Respiratory effort normal .Lungs sounds clear bilaterally. No wheezes, crackles, or rhonchi.  Gastrointestinal: Soft, non tender, and non distended with positive bowel sounds. No rebound or guarding. Genitourinary: No CVA tenderness. Musculoskeletal:  Left leg shortened Neurologic: . Face is symmetric. Moving all extremities. No gross focal neurologic deficits . Skin: Skin is warm, dry.  No rash or ulcers Psychiatric: Mood and affect are normal   Labs on Admission: I have personally reviewed following labs and imaging studies  CBC: Recent Labs  Lab 03/26/20 0316  WBC 10.6*  HGB 12.2  HCT 37.1  MCV 96.6  PLT 265   Basic Metabolic Panel: Recent Labs  Lab 03/26/20 0316  NA 139  K 3.9  CL 101  CO2 26  GLUCOSE 125*  BUN 23  CREATININE 0.81  CALCIUM 9.3   GFR: Estimated Creatinine Clearance: 42.3 mL/min (by C-G formula based on SCr of 0.81 mg/dL). Liver Function Tests: Recent Labs  Lab 03/26/20 0316  AST 31  ALT 16  ALKPHOS 62  BILITOT 1.0  PROT 7.3  ALBUMIN 4.0   No results for input(s): LIPASE, AMYLASE in the last 168 hours. No results for input(s): AMMONIA in the last 168 hours. Coagulation Profile: Recent Labs  Lab 03/26/20 0316  INR 0.9   Cardiac Enzymes: No results for input(s): CKTOTAL, CKMB, CKMBINDEX, TROPONINI in the last 168 hours. BNP (last 3 results) No results for input(s): PROBNP in the last 8760 hours. HbA1C: No results for input(s): HGBA1C in the last 72 hours. CBG: No results for input(s): GLUCAP in the last 168 hours. Lipid Profile: No results for input(s): CHOL, HDL, LDLCALC, TRIG, CHOLHDL, LDLDIRECT in the last 72 hours. Thyroid Function Tests: No results for input(s): TSH, T4TOTAL, FREET4, T3FREE, THYROIDAB in the last 72 hours. Anemia Panel: No results for input(s): VITAMINB12, FOLATE, FERRITIN, TIBC, IRON, RETICCTPCT in the last 72 hours. Urine analysis: No results found for: COLORURINE, APPEARANCEUR, LABSPEC,  PHURINE, GLUCOSEU, HGBUR, BILIRUBINUR, KETONESUR, PROTEINUR, UROBILINOGEN, NITRITE, LEUKOCYTESUR  Radiological Exams on Admission: DG Chest 1 View  Result Date: 03/26/2020 CLINICAL DATA:  86 year old female with fall and left knee pain. EXAM: CHEST  1 VIEW COMPARISON:  Chest radiograph dated 10/18/2019 and CT dated 10/18/2019 FINDINGS: No focal consolidation, pleural effusion, or pneumothorax. There is chronic interstitial coarsening and bronchitic changes. Mild cardiomegaly. Atherosclerotic calcification of the aorta. Osteopenia with degenerative changes of the spine. T11 compression fracture as seen previously. No acute osseous pathology. Left shoulder arthroplasty. IMPRESSION: 1. No acute cardiopulmonary process. 2. Mild cardiomegaly. Electronically Signed   By: 10/20/2019 M.D.   On: 03/26/2020 03:46   CT Head Wo Contrast  Result Date: 03/26/2020 CLINICAL DATA:  Unwitnessed fall. EXAM: CT HEAD WITHOUT CONTRAST CT CERVICAL SPINE WITHOUT  CONTRAST TECHNIQUE: Multidetector CT imaging of the head and cervical spine was performed following the standard protocol without intravenous contrast. Multiplanar CT image reconstructions of the cervical spine were also generated. COMPARISON:  CT head and cervical spine 05/13/2019 FINDINGS: CT HEAD FINDINGS Brain: Atrophy and white matter changes are stable. Remote infarct of the left parietal lobe is stable. Chronic ischemic changes are evident in the thalami bilaterally and extending into the brainstem. The ventricles are proportionate to the degree of atrophy. No significant extraaxial fluid collection is present. The brainstem and cerebellum are otherwise within normal limits. Vascular: Atherosclerotic calcifications within the cavernous internal carotid arteries and vertebral arteries at the dural margin bilaterally are stable. No hyperdense vessel is present. Skull: Calvarium is intact. No focal lytic or blastic lesions are present. No significant extracranial  soft tissue lesion is present. Sinuses/Orbits: The paranasal sinuses and mastoid air cells are clear. Bilateral lens replacements are noted. Globes and orbits are otherwise unremarkable. CT CERVICAL SPINE FINDINGS Alignment: Grade 1 degenerative anterolisthesis at C3-4, C4-5, C7-T1, and T1-2 is stable. Skull base and vertebrae: Degenerative changes are present C1-2. Craniocervical junction is otherwise normal. No acute abnormalities are present. Soft tissues and spinal canal: No prevertebral fluid or swelling. No visible canal hematoma. Disc levels: Right foraminal narrowing at C3-4 C4-5 and left foraminal narrowing at C5-6 is stable. No new or significantly progressive disc disease is present. Central canal stenosis is most severe at C5-6, stable Upper chest: Scarring at the right lung apex is stable. Thoracic inlet is within normal limits. IMPRESSION: 1. No acute trauma to the head or cervical spine. 2. Stable atrophy and white matter disease. 3. Stable multilevel degenerative changes of the cervical spine as described. Electronically Signed   By: Marin Roberts M.D.   On: 03/26/2020 04:34   CT Cervical Spine Wo Contrast  Result Date: 03/26/2020 CLINICAL DATA:  Unwitnessed fall. EXAM: CT HEAD WITHOUT CONTRAST CT CERVICAL SPINE WITHOUT CONTRAST TECHNIQUE: Multidetector CT imaging of the head and cervical spine was performed following the standard protocol without intravenous contrast. Multiplanar CT image reconstructions of the cervical spine were also generated. COMPARISON:  CT head and cervical spine 05/13/2019 FINDINGS: CT HEAD FINDINGS Brain: Atrophy and white matter changes are stable. Remote infarct of the left parietal lobe is stable. Chronic ischemic changes are evident in the thalami bilaterally and extending into the brainstem. The ventricles are proportionate to the degree of atrophy. No significant extraaxial fluid collection is present. The brainstem and cerebellum are otherwise within normal  limits. Vascular: Atherosclerotic calcifications within the cavernous internal carotid arteries and vertebral arteries at the dural margin bilaterally are stable. No hyperdense vessel is present. Skull: Calvarium is intact. No focal lytic or blastic lesions are present. No significant extracranial soft tissue lesion is present. Sinuses/Orbits: The paranasal sinuses and mastoid air cells are clear. Bilateral lens replacements are noted. Globes and orbits are otherwise unremarkable. CT CERVICAL SPINE FINDINGS Alignment: Grade 1 degenerative anterolisthesis at C3-4, C4-5, C7-T1, and T1-2 is stable. Skull base and vertebrae: Degenerative changes are present C1-2. Craniocervical junction is otherwise normal. No acute abnormalities are present. Soft tissues and spinal canal: No prevertebral fluid or swelling. No visible canal hematoma. Disc levels: Right foraminal narrowing at C3-4 C4-5 and left foraminal narrowing at C5-6 is stable. No new or significantly progressive disc disease is present. Central canal stenosis is most severe at C5-6, stable Upper chest: Scarring at the right lung apex is stable. Thoracic inlet is within normal limits. IMPRESSION: 1.  No acute trauma to the head or cervical spine. 2. Stable atrophy and white matter disease. 3. Stable multilevel degenerative changes of the cervical spine as described. Electronically Signed   By: Marin Roberts M.D.   On: 03/26/2020 04:34   DG Hip Unilat W or Wo Pelvis 2-3 Views Left  Result Date: 03/26/2020 CLINICAL DATA:  Fall, left hip pain EXAM: DG HIP (WITH OR WITHOUT PELVIS) 2-3V LEFT COMPARISON:  None. FINDINGS: Single view radiograph pelvis and two view radiograph left hip demonstrates a transcervical left femoral neck fracture with marked varus angulation of the distal fracture fragment. Femoral head appears seated within the left acetabulum but demonstrates marked internal rotation. Left hip joint space appears preserved. Pelvis and sacrum appear  intact. Right hip bipolar hemiarthroplasty has been performed. Degenerative changes are seen within the lumbar spine. IMPRESSION: Transcervical left femoral neck fracture with marked varus angulation. Electronically Signed   By: Helyn Numbers MD   On: 03/26/2020 03:48   DG Femur Portable Min 2 Views Left  Result Date: 03/26/2020 CLINICAL DATA:  Fall, left hip pain EXAM: LEFT FEMUR PORTABLE 2 VIEWS COMPARISON:  None. FINDINGS: Two view radiograph of the left femur demonstrates a transcervical femoral neck fracture of the left hip with marked varus angulation of the distal fracture fragment. The femoral head is still seated within the left acetabulum and the left hip joint space appears preserved. No other fracture or dislocation identified. IMPRESSION: Transcervical left femoral neck fracture with marked varus angulation. Electronically Signed   By: Helyn Numbers MD   On: 03/26/2020 03:47      Assessment/Plan 86 year old female with history of dementia, hypertension and osteoarthritis presenting with left hip fracture from an unwitnessed but suspected accidental fall    Left displaced femoral neck fracture (HCC)   Accidental fall   Preoperative clearance -Patient with few chronic medical problems.  At low risk of perioperative cardiopulmonary events and can proceed with proposed orthopedic repair -N.p.o., pain control, IV hydration -Further orders per orthopedics (Dr. Hyacinth Meeker to do surgery later today or tomorrow)    Dementia without behavioral disturbance (HCC) -Ativan/Haldol as needed if needed    Essential hypertension -Hydralazine as needed while n.p.o.    Primary osteoarthritis involving multiple joints -Pain management as above    DVT prophylaxis: SCDs Code Status: full code  Family Communication:  none  Disposition Plan: Back to previous home environment Consults called: Orthopedics status: Inpatient status for procedure   Andris Baumann MD [facility]     03/26/2020, 4:44 AM

## 2020-03-26 NOTE — Consult Note (Signed)
ORTHOPAEDIC CONSULTATION  REQUESTING PHYSICIAN: Andris Baumann, MD  Chief Complaint: Left hip pain  HPI: Kayla Fowler is a 86 y.o. female who complains of left hip pain after a fall at her family home setting early this morning.  The patient was seen in the emergency room where exam and x-rays revealed a displaced subcapital fracture of the left hip.  Patient is generally healthy and takes very little medications.  She does have dementia.  No known history of heart disease or hypertension.  She does take Cymbalta and Remeron and trazodone.  Patient had a prior right hip fracture in the past with a hemiarthroplasty done.  History is that the patient is active walking and is a candidate for surgery.  I have left a message for her legal guardian [name] to try and get in touch with me concerning the plans for surgery.  I hemiarthroplasty of the left hip would be indicated. Past Medical History:  Diagnosis Date  . Dementia (HCC) hypertension  . Hypertension    History reviewed. No pertinent surgical history. Social History   Socioeconomic History  . Marital status: Single    Spouse name: Not on file  . Number of children: Not on file  . Years of education: Not on file  . Highest education level: Not on file  Occupational History  . Not on file  Tobacco Use  . Smoking status: Never Smoker  . Smokeless tobacco: Never Used  Substance and Sexual Activity  . Alcohol use: Never  . Drug use: Never  . Sexual activity: Not Currently  Other Topics Concern  . Not on file  Social History Narrative  . Not on file   Social Determinants of Health   Financial Resource Strain:   . Difficulty of Paying Living Expenses: Not on file  Food Insecurity:   . Worried About Programme researcher, broadcasting/film/video in the Last Year: Not on file  . Ran Out of Food in the Last Year: Not on file  Transportation Needs:   . Lack of Transportation (Medical): Not on file  . Lack of Transportation (Non-Medical): Not on file   Physical Activity:   . Days of Exercise per Week: Not on file  . Minutes of Exercise per Session: Not on file  Stress:   . Feeling of Stress : Not on file  Social Connections:   . Frequency of Communication with Friends and Family: Not on file  . Frequency of Social Gatherings with Friends and Family: Not on file  . Attends Religious Services: Not on file  . Active Member of Clubs or Organizations: Not on file  . Attends Banker Meetings: Not on file  . Marital Status: Not on file   History reviewed. No pertinent family history. Allergies  Allergen Reactions  . Ibuprofen Other (See Comments)    Reaction: unknown  . Neomycin Other (See Comments)    Reaction: unknown  . Percocet [Oxycodone-Acetaminophen] Other (See Comments)    Reaction: unknown  . Triple Antibiotic [Bacitracin-Neomycin-Polymyxin] Other (See Comments)    Reaction: unknown   Prior to Admission medications   Medication Sig Start Date End Date Taking? Authorizing Provider  acetaminophen (TYLENOL) 500 MG tablet Take 1,000 mg by mouth every 8 (eight) hours.   Yes [provider]  Cholecalciferol 50 MCG (2000 UT) TABS Take 2,000 Units by mouth daily.   Yes [provider]  diclofenac sodium (VOLTAREN) 1 % GEL Apply 2 g topically 2 (two) times daily. Apply to  right shoulder and neck   Yes [provider]  DULoxetine (CYMBALTA) 20 MG capsule Take 40 mg by mouth daily.    Yes [provider]  loperamide (IMODIUM A-D) 2 MG tablet Take 2-4 mg by mouth 4 (four) times daily as needed for diarrhea or loose stools. Take 2 tablets (4 mg) when diarrhea starts, then 1 tablet (2 mg) for additional doses   Yes [provider]  Melatonin 3 MG TABS Take 3 mg by mouth at bedtime.    Yes [provider]  Menthol (HALLS COUGH DROPS) 5.8 MG LOZG Use as directed 1 lozenge in the mouth or throat every 3 (three) hours as needed (sore throat).   Yes [provider]   mirtazapine (REMERON) 15 MG tablet Take 7.5 mg by mouth at bedtime.   Yes [provider]  Multiple Vitamins-Minerals (THEREMS-M) TABS Take 1 tablet by mouth daily.   Yes [provider]  senna (SENOKOT) 8.6 MG TABS tablet Take 1 tablet by mouth at bedtime as needed for mild constipation.   Yes [provider]  traZODone (DESYREL) 100 MG tablet Take 100 mg by mouth at bedtime.   Yes [provider]   DG Chest 1 View  Result Date: 03/26/2020 CLINICAL DATA:  86 year old female with fall and left knee pain. EXAM: CHEST  1 VIEW COMPARISON:  Chest radiograph dated 10/18/2019 and CT dated 10/18/2019 FINDINGS: No focal consolidation, pleural effusion, or pneumothorax. There is chronic interstitial coarsening and bronchitic changes. Mild cardiomegaly. Atherosclerotic calcification of the aorta. Osteopenia with degenerative changes of the spine. T11 compression fracture as seen previously. No acute osseous pathology. Left shoulder arthroplasty. IMPRESSION: 1. No acute cardiopulmonary process. 2. Mild cardiomegaly. Electronically Signed   By: Elgie Collard M.D.   On: 03/26/2020 03:46   CT Head Wo Contrast  Result Date: 03/26/2020 CLINICAL DATA:  Unwitnessed fall. EXAM: CT HEAD WITHOUT CONTRAST CT CERVICAL SPINE WITHOUT CONTRAST TECHNIQUE: Multidetector CT imaging of the head and cervical spine was performed following the standard protocol without intravenous contrast. Multiplanar CT image reconstructions of the cervical spine were also generated. COMPARISON:  CT head and cervical spine 05/13/2019 FINDINGS: CT HEAD FINDINGS Brain: Atrophy and white matter changes are stable. Remote infarct of the left parietal lobe is stable. Chronic ischemic changes are evident in the thalami bilaterally and extending into the brainstem. The ventricles are proportionate to the degree of atrophy. No significant extraaxial fluid collection is present. The brainstem and cerebellum are otherwise  within normal limits. Vascular: Atherosclerotic calcifications within the cavernous internal carotid arteries and vertebral arteries at the dural margin bilaterally are stable. No hyperdense vessel is present. Skull: Calvarium is intact. No focal lytic or blastic lesions are present. No significant extracranial soft tissue lesion is present. Sinuses/Orbits: The paranasal sinuses and mastoid air cells are clear. Bilateral lens replacements are noted. Globes and orbits are otherwise unremarkable. CT CERVICAL SPINE FINDINGS Alignment: Grade 1 degenerative anterolisthesis at C3-4, C4-5, C7-T1, and T1-2 is stable. Skull base and vertebrae: Degenerative changes are present C1-2. Craniocervical junction is otherwise normal. No acute abnormalities are present. Soft tissues and spinal canal: No prevertebral fluid or swelling. No visible canal hematoma. Disc levels: Right foraminal narrowing at C3-4 C4-5 and left foraminal narrowing at C5-6 is stable. No new or significantly progressive disc disease is present. Central canal stenosis is most severe at C5-6, stable Upper chest: Scarring at the right lung apex is stable. Thoracic inlet is within normal limits. IMPRESSION: 1. No  acute trauma to the head or cervical spine. 2. Stable atrophy and white matter disease. 3. Stable multilevel degenerative changes of the cervical spine as described. Electronically Signed   By: Marin Robertshristopher  Mattern M.D.   On: 03/26/2020 04:34   CT Cervical Spine Wo Contrast  Result Date: 03/26/2020 CLINICAL DATA:  Unwitnessed fall. EXAM: CT HEAD WITHOUT CONTRAST CT CERVICAL SPINE WITHOUT CONTRAST TECHNIQUE: Multidetector CT imaging of the head and cervical spine was performed following the standard protocol without intravenous contrast. Multiplanar CT image reconstructions of the cervical spine were also generated. COMPARISON:  CT head and cervical spine 05/13/2019 FINDINGS: CT HEAD FINDINGS Brain: Atrophy and white matter changes are stable. Remote  infarct of the left parietal lobe is stable. Chronic ischemic changes are evident in the thalami bilaterally and extending into the brainstem. The ventricles are proportionate to the degree of atrophy. No significant extraaxial fluid collection is present. The brainstem and cerebellum are otherwise within normal limits. Vascular: Atherosclerotic calcifications within the cavernous internal carotid arteries and vertebral arteries at the dural margin bilaterally are stable. No hyperdense vessel is present. Skull: Calvarium is intact. No focal lytic or blastic lesions are present. No significant extracranial soft tissue lesion is present. Sinuses/Orbits: The paranasal sinuses and mastoid air cells are clear. Bilateral lens replacements are noted. Globes and orbits are otherwise unremarkable. CT CERVICAL SPINE FINDINGS Alignment: Grade 1 degenerative anterolisthesis at C3-4, C4-5, C7-T1, and T1-2 is stable. Skull base and vertebrae: Degenerative changes are present C1-2. Craniocervical junction is otherwise normal. No acute abnormalities are present. Soft tissues and spinal canal: No prevertebral fluid or swelling. No visible canal hematoma. Disc levels: Right foraminal narrowing at C3-4 C4-5 and left foraminal narrowing at C5-6 is stable. No new or significantly progressive disc disease is present. Central canal stenosis is most severe at C5-6, stable Upper chest: Scarring at the right lung apex is stable. Thoracic inlet is within normal limits. IMPRESSION: 1. No acute trauma to the head or cervical spine. 2. Stable atrophy and white matter disease. 3. Stable multilevel degenerative changes of the cervical spine as described. Electronically Signed   By: Marin Robertshristopher  Mattern M.D.   On: 03/26/2020 04:34   DG Hip Unilat W or Wo Pelvis 2-3 Views Left  Result Date: 03/26/2020 CLINICAL DATA:  Fall, left hip pain EXAM: DG HIP (WITH OR WITHOUT PELVIS) 2-3V LEFT COMPARISON:  None. FINDINGS: Single view radiograph pelvis and  two view radiograph left hip demonstrates a transcervical left femoral neck fracture with marked varus angulation of the distal fracture fragment. Femoral head appears seated within the left acetabulum but demonstrates marked internal rotation. Left hip joint space appears preserved. Pelvis and sacrum appear intact. Right hip bipolar hemiarthroplasty has been performed. Degenerative changes are seen within the lumbar spine. IMPRESSION: Transcervical left femoral neck fracture with marked varus angulation. Electronically Signed   By: Helyn NumbersAshesh  Parikh MD   On: 03/26/2020 03:48   DG Femur Portable Min 2 Views Left  Result Date: 03/26/2020 CLINICAL DATA:  Fall, left hip pain EXAM: LEFT FEMUR PORTABLE 2 VIEWS COMPARISON:  None. FINDINGS: Two view radiograph of the left femur demonstrates a transcervical femoral neck fracture of the left hip with marked varus angulation of the distal fracture fragment. The femoral head is still seated within the left acetabulum and the left hip joint space appears preserved. No other fracture or dislocation identified. IMPRESSION: Transcervical left femoral neck fracture with marked varus angulation. Electronically Signed   By: Helyn NumbersAshesh  Parikh MD  On: 03/26/2020 03:47    Positive ROS: All other systems have been reviewed and were otherwise negative with the exception of those mentioned in the HPI and as above.  Physical Exam: General: Alert, no acute distress Cardiovascular: No pedal edema Respiratory: No cyanosis, no use of accessory musculature GI: No organomegaly, abdomen is soft and non-tender Skin: No lesions in the area of chief complaint Neurologic: Sensation intact distally Psychiatric: Patient is competent for consent with normal mood and affect Lymphatic: No axillary or cervical lymphadenopathy  MUSCULOSKELETAL: Patient is thin and lying quietly in bed.  She is confused and does not answer questions appropriately.  The left leg is slightly shortened and x-ray  rotated.  There is pain with motion of the left hip.  The skin is intact.  Neurovascular status is good distally.  All other extremities move well without any obvious pain or swelling.  Assessment: Displaced subcapital fracture left hip  Plan: Left hip hemiarthroplasty once permission has been obtained for this.  We would plan to do this later this afternoon or early evening.    Valinda Hoar, MD [phone]   03/26/2020 11:44 AM

## 2020-03-26 NOTE — Progress Notes (Signed)
PROGRESS NOTE    Patient: Kayla Fowler                            PCP: Theodosia Blender, MD                    DOB: 02-25-1927            DOA: 03/26/2020 [id number]:[id number]             DOS: 03/26/2020, 7:25 AM   LOS: 0 days   Date of Service: The patient was seen and examined on 03/26/2020  Subjective:   The patient was seen and examined this morning, stable no acute distress, pleasantly demented. Still complaining of hip pain, with minimal movement. Denies any chest pain or shortness of breath  Brief Narrative:   HPI: Kayla Fowler is a 85 y.o. female with medical history significant for dementia who presents following an unwitnessed fall with complaints of pain to the left hip.  History limited due to dementia...   ED Course:  Hemodynamically stable,, labs within normal limits,  Troponin 14.  Patient had extensive imaging studies including CT head, C-spine, hip and chest.  X-rays of hip showed transcervical left femoral neck fracture with marked varus angulation.  Orthopedist Dr. Hyacinth Meeker consulted.   Assessment & Plan:   Principal Problem:   Left displaced femoral neck fracture (HCC) Active Problems:   Dementia without behavioral disturbance (HCC)   Accidental fall   Preoperative clearance   Essential hypertension   Primary osteoarthritis involving multiple joints    Left displaced femoral neck fracture (HCC)/ Accidental fall  Preoperative clearance -Patient is currently cleared to proceed with orthopedic surgery The benefit outweighs the risk -patient is low to moderate risk due to age, dementia, OA Preoperative cardiopulmonary event remains low -Left hip pain, range of motion limited due to pain  -N.p.o., pain control, IV hydration -Further orders per orthopedics (Dr. Hyacinth Meeker to do surgery later today or tomorrow)    Dementia without behavioral disturbance (HCC) -Ativan/Haldol as needed if needed -Remained stable, calm collected cooperative    Essential  hypertension -Hydralazine as needed while n.p.o. -Stable    Primary osteoarthritis involving multiple joints -Pain management as above -Remained stable    Antimicrobials:  Preoperative antibiotics   Consultants: Ortho. Dr. Hyacinth Meeker   ---------------------------------------------------------------------------------------------------------------------------------------------  DVT prophylaxis:  SCD/Compression stockings Code Status:   Code Status: Full Code Family Communication: No family member present at bedside- attempt will be made to update daily The above findings and plan of care has been discussed with patient (and family )  in detail,  -Advance care planning has been discussed.   Admission status:    Status is: Inpatient  Remains inpatient appropriate because:Inpatient level of care appropriate due to severity of illness   Dispo: The patient is from: ALF              Anticipated d/c is to: SNF              Anticipated d/c date is: 3 days              Patient currently is not medically stable to d/c.         Procedures:   No admission procedures for hospital encounter.     Antimicrobials:  Anti-infectives (From admission, onward)   Start     Dose/Rate Route Frequency Ordered Stop   03/26/20 0600  ceFAZolin (ANCEF) IVPB 2g/100 mL premix  2 g 200 mL/hr over 30 Minutes Intravenous On call to O.R. 03/26/20 0501 03/27/20 0559   03/26/20 0600  clindamycin (CLEOCIN) IVPB 600 mg        600 mg 100 mL/hr over 30 Minutes Intravenous On call to O.R. 03/26/20 0501 03/27/20 0559       Medication:  . [START ON 03/27/2020] chlorhexidine  1 application Topical Once    HYDROcodone-acetaminophen, morphine injection, morphine injection   Objective:   Vitals:   03/26/20 0310 03/26/20 0311 03/26/20 0640  BP: (!) 146/81    Pulse: 91  84  Resp: 16  16  Temp: 98.9 F (37.2 C)    TempSrc: Oral    SpO2: 95%  94%  Weight:  63.5 kg   Height:  5\' 6"   (1.676 m)    No intake or output data in the 24 hours ending 03/26/20 0725 Filed Weights   03/26/20 0311  Weight: 63.5 kg     Examination:   Physical Exam  Constitution:  Alert, cooperative, no distress,  Appears calm and comfortable  Psychiatric mood stable, pleasantly demented HEENT: Normocephalic, PERRL, otherwise with in Normal limits  Chest:Chest symmetric Cardio vascular:  S1/S2, RRR, No murmure, No Rubs or Gallops  pulmonary: Clear to auscultation bilaterally, respirations unlabored, negative wheezes / crackles Abdomen: Soft, non-tender, non-distended, bowel sounds,no masses, no organomegaly Muscular skeletal:  Left hip pain, ROM limited Limited exam - in bed, able to move all 4 extremities, Normal strength,  Neuro: CNII-XII intact. , normal motor and sensation, reflexes intact  Extremities: No pitting edema lower extremities, +2 pulses  Skin: Dry, warm to touch, negative for any Rashes, No open wounds Wounds: per nursing documentation    ------------------------------------------------------------------------------------------------------------------------------------------    LABs:  CBC Latest Ref Rng & Units 03/26/2020 10/18/2019  WBC 4.0 - 10.5 K/uL 10.6(H) 5.8  Hemoglobin 12.0 - 15.0 g/dL 10/20/2019 56.2  Hematocrit 36 - 46 % 37.1 41.2  Platelets 150 - 400 K/uL 265 333   CMP Latest Ref Rng & Units 03/26/2020 10/18/2019  Glucose 70 - 99 mg/dL 10/20/2019) 865(H)  BUN 8 - 23 mg/dL 23 846(N)  Creatinine 62(X - 1.00 mg/dL 5.28 4.13  Sodium 2.44 - 145 mmol/L 139 139  Potassium 3.5 - 5.1 mmol/L 3.9 4.2  Chloride 98 - 111 mmol/L 101 99  CO2 22 - 32 mmol/L 26 30  Calcium 8.9 - 10.3 mg/dL 9.3 9.6  Total Protein 6.5 - 8.1 g/dL 7.3 -  Total Bilirubin 0.3 - 1.2 mg/dL 1.0 -  Alkaline Phos 38 - 126 U/L 62 -  AST 15 - 41 U/L 31 -  ALT 0 - 44 U/L 16 -       Micro Results No results found for this or any previous visit (from the past 240 hour(s)).  Radiology Reports DG Chest 1  View  Result Date: 03/26/2020 CLINICAL DATA:  86 year old female with fall and left knee pain. EXAM: CHEST  1 VIEW COMPARISON:  Chest radiograph dated 10/18/2019 and CT dated 10/18/2019 FINDINGS: No focal consolidation, pleural effusion, or pneumothorax. There is chronic interstitial coarsening and bronchitic changes. Mild cardiomegaly. Atherosclerotic calcification of the aorta. Osteopenia with degenerative changes of the spine. T11 compression fracture as seen previously. No acute osseous pathology. Left shoulder arthroplasty. IMPRESSION: 1. No acute cardiopulmonary process. 2. Mild cardiomegaly. Electronically Signed   By: 10/20/2019 M.D.   On: 03/26/2020 03:46   CT Head Wo Contrast  Result Date: 03/26/2020 CLINICAL DATA:  Unwitnessed fall. EXAM: CT HEAD  WITHOUT CONTRAST CT CERVICAL SPINE WITHOUT CONTRAST TECHNIQUE: Multidetector CT imaging of the head and cervical spine was performed following the standard protocol without intravenous contrast. Multiplanar CT image reconstructions of the cervical spine were also generated. COMPARISON:  CT head and cervical spine 05/13/2019 FINDINGS: CT HEAD FINDINGS Brain: Atrophy and white matter changes are stable. Remote infarct of the left parietal lobe is stable. Chronic ischemic changes are evident in the thalami bilaterally and extending into the brainstem. The ventricles are proportionate to the degree of atrophy. No significant extraaxial fluid collection is present. The brainstem and cerebellum are otherwise within normal limits. Vascular: Atherosclerotic calcifications within the cavernous internal carotid arteries and vertebral arteries at the dural margin bilaterally are stable. No hyperdense vessel is present. Skull: Calvarium is intact. No focal lytic or blastic lesions are present. No significant extracranial soft tissue lesion is present. Sinuses/Orbits: The paranasal sinuses and mastoid air cells are clear. Bilateral lens replacements are noted.  Globes and orbits are otherwise unremarkable. CT CERVICAL SPINE FINDINGS Alignment: Grade 1 degenerative anterolisthesis at C3-4, C4-5, C7-T1, and T1-2 is stable. Skull base and vertebrae: Degenerative changes are present C1-2. Craniocervical junction is otherwise normal. No acute abnormalities are present. Soft tissues and spinal canal: No prevertebral fluid or swelling. No visible canal hematoma. Disc levels: Right foraminal narrowing at C3-4 C4-5 and left foraminal narrowing at C5-6 is stable. No new or significantly progressive disc disease is present. Central canal stenosis is most severe at C5-6, stable Upper chest: Scarring at the right lung apex is stable. Thoracic inlet is within normal limits. IMPRESSION: 1. No acute trauma to the head or cervical spine. 2. Stable atrophy and white matter disease. 3. Stable multilevel degenerative changes of the cervical spine as described. Electronically Signed   By: Marin Robertshristopher  Mattern M.D.   On: 03/26/2020 04:34   CT Cervical Spine Wo Contrast  Result Date: 03/26/2020 CLINICAL DATA:  Unwitnessed fall. EXAM: CT HEAD WITHOUT CONTRAST CT CERVICAL SPINE WITHOUT CONTRAST TECHNIQUE: Multidetector CT imaging of the head and cervical spine was performed following the standard protocol without intravenous contrast. Multiplanar CT image reconstructions of the cervical spine were also generated. COMPARISON:  CT head and cervical spine 05/13/2019 FINDINGS: CT HEAD FINDINGS Brain: Atrophy and white matter changes are stable. Remote infarct of the left parietal lobe is stable. Chronic ischemic changes are evident in the thalami bilaterally and extending into the brainstem. The ventricles are proportionate to the degree of atrophy. No significant extraaxial fluid collection is present. The brainstem and cerebellum are otherwise within normal limits. Vascular: Atherosclerotic calcifications within the cavernous internal carotid arteries and vertebral arteries at the dural margin  bilaterally are stable. No hyperdense vessel is present. Skull: Calvarium is intact. No focal lytic or blastic lesions are present. No significant extracranial soft tissue lesion is present. Sinuses/Orbits: The paranasal sinuses and mastoid air cells are clear. Bilateral lens replacements are noted. Globes and orbits are otherwise unremarkable. CT CERVICAL SPINE FINDINGS Alignment: Grade 1 degenerative anterolisthesis at C3-4, C4-5, C7-T1, and T1-2 is stable. Skull base and vertebrae: Degenerative changes are present C1-2. Craniocervical junction is otherwise normal. No acute abnormalities are present. Soft tissues and spinal canal: No prevertebral fluid or swelling. No visible canal hematoma. Disc levels: Right foraminal narrowing at C3-4 C4-5 and left foraminal narrowing at C5-6 is stable. No new or significantly progressive disc disease is present. Central canal stenosis is most severe at C5-6, stable Upper chest: Scarring at the right lung apex is stable. Thoracic inlet  is within normal limits. IMPRESSION: 1. No acute trauma to the head or cervical spine. 2. Stable atrophy and white matter disease. 3. Stable multilevel degenerative changes of the cervical spine as described. Electronically Signed   By: Marin Roberts M.D.   On: 03/26/2020 04:34   DG Hip Unilat W or Wo Pelvis 2-3 Views Left  Result Date: 03/26/2020 CLINICAL DATA:  Fall, left hip pain EXAM: DG HIP (WITH OR WITHOUT PELVIS) 2-3V LEFT COMPARISON:  None. FINDINGS: Single view radiograph pelvis and two view radiograph left hip demonstrates a transcervical left femoral neck fracture with marked varus angulation of the distal fracture fragment. Femoral head appears seated within the left acetabulum but demonstrates marked internal rotation. Left hip joint space appears preserved. Pelvis and sacrum appear intact. Right hip bipolar hemiarthroplasty has been performed. Degenerative changes are seen within the lumbar spine. IMPRESSION:  Transcervical left femoral neck fracture with marked varus angulation. Electronically Signed   By: Helyn Numbers MD   On: 03/26/2020 03:48   DG Femur Portable Min 2 Views Left  Result Date: 03/26/2020 CLINICAL DATA:  Fall, left hip pain EXAM: LEFT FEMUR PORTABLE 2 VIEWS COMPARISON:  None. FINDINGS: Two view radiograph of the left femur demonstrates a transcervical femoral neck fracture of the left hip with marked varus angulation of the distal fracture fragment. The femoral head is still seated within the left acetabulum and the left hip joint space appears preserved. No other fracture or dislocation identified. IMPRESSION: Transcervical left femoral neck fracture with marked varus angulation. Electronically Signed   By: Helyn Numbers MD   On: 03/26/2020 03:47    SIGNED: Kendell Bane, MD, FACP, FHM. [facility],  Pager (please use [email or web address] to page/text)  If 7PM-7AM, please contact night-coverage [email or web address], Password [facility] 03/26/2020, 7:25 AM

## 2020-03-26 NOTE — ED Notes (Signed)
Pt has removed iv. Pt is currently dry, iv restarted.

## 2020-03-26 NOTE — Progress Notes (Signed)
Received pt from ED with L hip fracture, VS stable from a facility, MRSA swab was done and positive. Moved pt to Low bed, SCD placed. Pt for surgery, consent has been signed, done by ED RN, currently NPO. Bucks traction was ordered but cant be done since pt is on low bed and no supplies available, Designer, jewellery secured chat MD and permitted to hold off from traction since he is schedule for surgery.

## 2020-03-26 NOTE — ED Notes (Signed)
Attempted to call report- per secretary RN will return call when available.

## 2020-03-26 NOTE — Anesthesia Preprocedure Evaluation (Addendum)
Anesthesia Evaluation  Patient identified by MRN, date of birth, ID bandGeneral Assessment Comment:Patient drowsy, responsive to questions.  Reviewed: Allergy & Precautions, H&P , NPO status , Patient's Chart, lab work & pertinent test results  History of Anesthesia Complications Negative for: history of anesthetic complications  Airway Mallampati: III  TM Distance: >3 FB Neck ROM: Full    Dental  (+) Poor Dentition, Edentulous Upper, Partial Lower   Pulmonary neg pulmonary ROS, neg sleep apnea, neg COPD,    Pulmonary exam normal breath sounds clear to auscultation       Cardiovascular hypertension, (-) angina(-) Past MI and (-) Cardiac Stents (-) dysrhythmias  Rhythm:Regular Rate:Normal - Systolic murmurs    Neuro/Psych PSYCHIATRIC DISORDERS Anxiety Dementia negative neurological ROS     GI/Hepatic negative GI ROS, Neg liver ROS,   Endo/Other  negative endocrine ROS  Renal/GU      Musculoskeletal   Abdominal   Peds  Hematology negative hematology ROS (+)   Anesthesia Other Findings Macular degeneration H/o HTN.  No known h/o other cardiopulmonary problems.  Past Medical History: hypertension: Dementia (HCC) No date: Hypertension  No past surgical history on file.  BMI    Body Mass Index: 22.60 kg/m      Reproductive/Obstetrics negative OB ROS                            Anesthesia Physical Anesthesia Plan  ASA: III  Anesthesia Plan: Spinal   Post-op Pain Management:    Induction: Intravenous  PONV Risk Score and Plan: 2 and Propofol infusion, Ondansetron and Dexamethasone  Airway Management Planned: Natural Airway  Additional Equipment: None  Intra-op Plan:   Post-operative Plan:   Informed Consent: I have reviewed the patients History and Physical, chart, labs and discussed the procedure including the risks, benefits and alternatives for the proposed anesthesia with  the patient or authorized representative who has indicated his/her understanding and acceptance.     Dental Advisory Given and Consent reviewed with POA  Plan Discussed with: CRNA and Surgeon  Anesthesia Plan Comments: (Phone consent for anesthesia obtained from Ms. Fisher of DSS, pt's legal guardian, on 03/26/20 at 1004.  KR  Discussed R/B/A of neuraxial anesthesia technique - rare risks of spinal/epidural hematoma, nerve damage, infection - Risk of PDPH  )       Anesthesia Quick Evaluation

## 2020-03-26 NOTE — ED Notes (Signed)
Report to heather, rn

## 2020-03-26 NOTE — ED Notes (Signed)
[facility] SOCIAL [facility] WITH [place] DSS IS  [facility] AT [phone] OR [name] AT [phone], CAN CALL EITHER FOR SURGICAL CONSENT.

## 2020-03-26 NOTE — ED Triage Notes (Signed)
Pt with unwitnessed fall from assisted living at 1900 yesterday. Pt complains of left femur and hip pain. Pt with history of dementia. md at bedside ona rrival.

## 2020-03-26 NOTE — Op Note (Addendum)
03/26/2020  7:08 PM  PATIENT:  Kayla Fowler   MRN: [id number]  PRE-OPERATIVE DIAGNOSIS:  Displaced Subcapital fracture left hip   POST-OPERATIVE DIAGNOSIS: Same  PROCEDURE:  left   hip hemiarthroplasty with Stryker Accolade prosthesis  PREOPERATIVE INDICATIONS:  Kayla Fowler is an 86 y.o. female who was admitted 03/26/2020 with a diagnosis of displaced subcapital fracture of the hip and elected for surgical management.  The risks benefits and alternatives were discussed with the patient including but not limited to the risks of nonoperative treatment, versus surgical intervention including infection, bleeding, nerve injury, periprosthetic fracture, the need for revision surgery, dislocation, leg length discrepancy, blood clots, cardiopulmonary complications, morbidity, mortality, among others, and they were willing to proceed.  Predicted outcome is good, although there will be at least a six to nine month expected recovery.     SURGEON:  Earnestine Leys, MD  ASST: Carlynn Spry, [facility]    ANESTHESIA: Spinal    COMPLICATIONS:  None.   EBL:  100 cc    COMPONENTS:  Stryker Accolade Femoral Fracture stem size # 5  ,   and a size   48 mm  fracture head unipolar hip ball with    0 mm  neck length.    PROCEDURE IN DETAIL: The patient was met in the holding area and identified.  The appropriate hip  was marked at the operative site. The patient was then transported to the OR and  placed under general anesthesia.  At that point, the patient was  placed in the lateral decubitus position with the operative side up and  secured to the operating room table and all bony prominences padded.     The operative lower extremity was prepped from the iliac crest to the toes.  Sterile draping was performed.  Time out was performed prior to incision.      A routine posterolateral approach was utilized via sharp dissection  carried down to the subcutaneous tissue.  Gross bleeders were Bovie  coagulated.  The  iliotibial band was identified and incised  along the length of the skin incision.  Self-retaining retractors were  inserted.  With the hip internally rotated, the short external rotators  were identified. The piriformis was tagged and the hip capsule released in a T-type fashion.  The femoral neck was exposed, and I resected the femoral neck using the appropriate jig. This was performed at approximately a thumb's breadth above the lesser trochanter.    I then exposed the deep acetabulum, cleared out any tissue including the ligamentum teres.    I then prepared the proximal femur using the cookie-cutter, the lateralizing reamer, and then sequentially broached.  A trial stem   was  utilized along with a unipolar head and neck.  I reduced the hip and it was found to have excellent stability with functional range of motion. Leg lengths were equal.  The trial components were then removed.   The same size Accolade femoral stem was then inserted and was very stable.  The Unitrax head and neck as trialed were inserted as well.     The hip was then reduced and taken through functional range of motion and found to have excellent stability. Leg lengths were restored.     I closed the T in the capsule with #2 Ticron as well as the short external rotators. A hemovac was inserted.    I then irrigated the hip copiously again with pulse lavage, and repaired the fascia with #2 Quill and the  subcutaneous layer with #0 Quill. Sponge and needle counts were correct. Dry sterile Aquacell was applied.   The patient was then awakened and returned to PACU in stable and satisfactory condition. There were no complications.  Kayla Breed, MD Orthopedic Surgeon [phone]   03/26/2020 7:08 PM

## 2020-03-26 NOTE — H&P (Signed)
THE PATIENT WAS SEEN PRIOR TO SURGERY TODAY.  HISTORY, ALLERGIES, HOME MEDICATIONS AND OPERATIVE PROCEDURE WERE REVIEWED. RISKS AND BENEFITS OF SURGERY DISCUSSED WITH PATIENT AGAIN.  NO CHANGES FROM INITIAL HISTORY AND PHYSICAL NOTED.    

## 2020-03-27 ENCOUNTER — Encounter: Payer: Self-pay | Admitting: Specialist

## 2020-03-27 LAB — BASIC METABOLIC PANEL
Anion gap: 10 (ref 5–15)
BUN: 17 mg/dL (ref 8–23)
CO2: 27 mmol/L (ref 22–32)
Calcium: 8.3 mg/dL — ABNORMAL LOW (ref 8.9–10.3)
Chloride: 97 mmol/L — ABNORMAL LOW (ref 98–111)
Creatinine, Ser: 0.72 mg/dL (ref 0.44–1.00)
GFR calc Af Amer: >60 mL/min (ref >60)
GFR calc non Af Amer: >60 mL/min (ref >60)
Glucose, Bld: 112 mg/dL — ABNORMAL HIGH (ref 70–99)
Potassium: 3.4 mmol/L — ABNORMAL LOW (ref 3.5–5.1)
Sodium: 134 mmol/L — ABNORMAL LOW (ref 135–145)

## 2020-03-27 LAB — CBC
HCT: 33.2 % — ABNORMAL LOW (ref 36.0–46.0)
Hemoglobin: 10.8 g/dL — ABNORMAL LOW (ref 12.0–15.0)
MCH: 31.6 pg (ref 26.0–34.0)
MCHC: 32.5 g/dL (ref 30.0–36.0)
MCV: 97.1 fL (ref 80.0–100.0)
Platelets: 215 10*3/uL (ref 150–400)
RBC: 3.42 MIL/uL — ABNORMAL LOW (ref 3.87–5.11)
RDW: 13 % (ref 11.5–15.5)
WBC: 7.2 10*3/uL (ref 4.0–10.5)
nRBC: 0 % (ref 0.0–0.2)

## 2020-03-27 LAB — LACTIC ACID, PLASMA: Lactic Acid, Venous: 0.9 mmol/L (ref 0.5–1.9)

## 2020-03-27 MED ORDER — LORAZEPAM 0.5 MG PO TABS
0.5000 mg | ORAL_TABLET | Freq: Four times a day (QID) | ORAL | Status: DC | PRN
  Administered 2020-03-28: 0.5 mg via ORAL
  Filled 2020-03-27: qty 1

## 2020-03-27 MED ORDER — HALOPERIDOL LACTATE 5 MG/ML IJ SOLN
1.0000 mg | Freq: Four times a day (QID) | INTRAMUSCULAR | Status: DC | PRN
  Administered 2020-03-27 – 2020-03-31 (×3): 1 mg via INTRAVENOUS
  Filled 2020-03-27 (×4): qty 1

## 2020-03-27 MED ORDER — ENOXAPARIN SODIUM 40 MG/0.4ML ~~LOC~~ SOLN
40.0000 mg | SUBCUTANEOUS | Status: DC
  Administered 2020-03-27 – 2020-03-29 (×3): 40 mg via SUBCUTANEOUS
  Filled 2020-03-27 (×4): qty 0.4

## 2020-03-27 MED ORDER — LORAZEPAM 2 MG/ML IJ SOLN
1.0000 mg | Freq: Once | INTRAMUSCULAR | Status: AC
  Administered 2020-03-27: 1 mg via INTRAVENOUS
  Filled 2020-03-27: qty 1

## 2020-03-27 MED ORDER — CEFAZOLIN SODIUM-DEXTROSE 2-4 GM/100ML-% IV SOLN
2.0000 g | Freq: Three times a day (TID) | INTRAVENOUS | Status: AC
  Administered 2020-03-27 (×2): 2 g via INTRAVENOUS
  Filled 2020-03-27 (×2): qty 100

## 2020-03-27 MED ORDER — LORAZEPAM 2 MG/ML IJ SOLN
0.5000 mg | Freq: Four times a day (QID) | INTRAMUSCULAR | Status: DC | PRN
  Administered 2020-03-27: 0.5 mg via INTRAMUSCULAR
  Filled 2020-03-27: qty 1

## 2020-03-27 NOTE — Progress Notes (Signed)
Spoke with DSS from [facility] ([place]) or Milbert Coulter supervisor

## 2020-03-27 NOTE — Progress Notes (Signed)
Physical Therapy Treatment Patient Details Name: Kayla Fowler MRN: [id number] DOB: [birth year] Today's Date: 03/27/2020    History of Present Illness 86 y/o female suffered an unwitnessed fall with L hip fracture and subsequent hemiarthroplasty    PT Comments    Attempted BID session this am.  Pt had removed O2 and sats 86-88% at rest.  Returned O2 which she allowed and increased to 92%.  She did allow RLE AAROM but resisted any attempts at LLE.  "That's my sore leg." She did voice during session that she has "Lost my will."  Encouragement given.  Initially pt thought to be appropriate to trial BID sessions. After discussion with primary PT, decision made to make pt QD.  Will increase as appropriate.   Follow Up Recommendations  SNF     Equipment Recommendations  Other (comment) (TBD at next venue of care)    Recommendations for Other Services       Precautions / Restrictions Precautions Precautions: Posterior Hip Restrictions Weight Bearing Restrictions: Yes LLE Weight Bearing: Partial weight bearing    Mobility  Bed Mobility Overal bed mobility: Needs Assistance Bed Mobility: Supine to Sit;Sit to Supine     Supine to sit: Max assist Sit to supine: Max assist   General bed mobility comments: tolerated transition to sitting better than expected (from pain stand-point) but offered little in terms of assisting with transition  Transfers Overall transfer level: Needs assistance Equipment used: Rolling walker (2 wheeled) Transfers: Sit to/from Stand Sit to Stand: Max assist            Ambulation/Gait             General Gait Details: unable/inappropriate   Social research officer, government Rankin (Stroke Patients Only)       Balance Overall balance assessment: Needs assistance Sitting-balance support: Bilateral upper extremity supported Sitting balance-Leahy Scale: Poor Sitting balance - Comments: Pt with inconsistent  ability to maintain sitting EOB     Standing balance-Leahy Scale: Poor Standing balance comment: constant assist to maintain standing with walker                            Cognition Arousal/Alertness: Awake/alert Behavior During Therapy: Anxious;Restless Overall Cognitive Status: History of cognitive impairments - at baseline                                 General Comments: unsure of actual baseline, but pt profoundly confused t/o session      Exercises Total Joint Exercises Ankle Circles/Pumps: AROM;AAROM;10 reps Quad Sets: AROM;10 reps Short Arc Quad: 10 reps;AAROM Heel Slides: AAROM;5 reps Hip ABduction/ADduction: AAROM;AROM;10 reps Other Exercises Other Exercises: supine AA/PROM RLE, resisted LLE    General Comments        Pertinent Vitals/Pain Pain Assessment: Faces Faces Pain Scale: Hurts whole lot Pain Location: L hip Pain Descriptors / Indicators: Sore Pain Intervention(s): Limited activity within patient's tolerance;Monitored during session;Repositioned    Home Living Family/patient expects to be discharged to:: Unsure               Additional Comments: Pt unable to answer, but appears she is at family care/group home situation    Prior Function Level of Independence: Needs assistance      Comments: Pt extremely confused, unable to report how much/what she  did but presumably she needed assist with most aspects of care secondary to cognition   PT Goals (current goals can now be found in the care plan section) Acute Rehab PT Goals Patient Stated Goal: unable to confirm PT Goal Formulation: Patient unable to participate in goal setting Time For Goal Achievement: 04/10/20 Potential to Achieve Goals: Fair Progress towards PT goals: Progressing toward goals    Frequency    7X/week      PT Plan Current plan remains appropriate    Co-evaluation              AM-PAC PT "6 Clicks" Mobility   Outcome Measure   Help needed turning from your back to your side while in a flat bed without using bedrails?: Total Help needed moving from lying on your back to sitting on the side of a flat bed without using bedrails?: Total Help needed moving to and from a bed to a chair (including a wheelchair)?: Total Help needed standing up from a chair using your arms (e.g., wheelchair or bedside chair)?: Total Help needed to walk in hospital room?: Total Help needed climbing 3-5 steps with a railing? : Total 6 Click Score: 6    End of Session Equipment Utilized During Treatment: Gait belt Activity Tolerance: Patient tolerated treatment well Patient left: in bed;with call bell/phone within reach;with bed alarm set;with SCD's reapplied Nurse Communication: Mobility status PT Visit Diagnosis: Muscle weakness (generalized) (M62.81);Difficulty in walking, not elsewhere classified (R26.2);Pain Pain - Right/Left: Left Pain - part of body: Hip     Time: 0947-0962 PT Time Calculation (min) (ACUTE ONLY): 8 min  Charges:  $Therapeutic Exercise: 8-22 mins $Therapeutic Activity: 8-22 mins                    Danielle Dess, PTA 03/27/20, 1:46 PM

## 2020-03-27 NOTE — Progress Notes (Signed)
2 episodes of incontinence and refuses external cath.

## 2020-03-27 NOTE — Progress Notes (Signed)
Subjective: 1 Day Post-Op Procedure(s) (LRB): ARTHROPLASTY BIPOLAR HIP (HEMIARTHROPLASTY) (Left)   Patient is very combative today and totally confused.  She is ripped her dressings off 4 times.  Her hands are mitted.  She will keep the abduction pillow on.  Dressing currently is dry.  Hip appears stable leg lengths good.  Patient reports pain as mild.  Objective:   VITALS:   Vitals:   03/27/20 0826 03/27/20 1301  BP:  135/82  Pulse:  (!) 126  Resp:  20  Temp: 98 F (36.7 C) 99 F (37.2 C)  SpO2:  96%    Neurologically intact Sensation intact distally Intact pulses distally Dorsiflexion/Plantar flexion intact  LABS Recent Labs    03/26/20 0316 03/27/20 0835  HGB 12.2 10.8*  HCT 37.1 33.2*  WBC 10.6* 7.2  PLT 265 215    Recent Labs    03/26/20 0316 03/27/20 0835  NA 139 134*  K 3.9 3.4*  BUN 23 17  CREATININE 0.81 0.72  GLUCOSE 125* 112*    Recent Labs    03/26/20 0316  INR 0.9     Assessment/Plan: 1 Day Post-Op Procedure(s) (LRB): ARTHROPLASTY BIPOLAR HIP (HEMIARTHROPLASTY) (Left)   Advance diet Up with therapy Discharge to SNF   We will discharge home on enteric-coated aspirin twice daily  Return to clinic 2 weeks for exam and x-ray after discharge

## 2020-03-27 NOTE — TOC Progression Note (Signed)
Transition of Care [facility]) - Progression Note    Patient Details  Name: Kayla Fowler MRN: [id number] Date of Birth: [birth year]  [facility] CM/SW Contact  Barrie Dunker, RN Phone Number: 03/27/2020, 1:10 PM  Clinical Narrative:   Spoke to Leavy Cella the guardian with [facility] DSS, her contact  [phone] cell and office [phone], She is agreeable for the patient to go to SNF and to have a bedsearch done, the patient has had her covid Vaccine and she will send the card.  FL2, PASr and bedsearch sent         Expected Discharge Plan and Services                                                 Social Determinants of Health (SDOH) Interventions    Readmission Risk Interventions No flowsheet data found.

## 2020-03-27 NOTE — Progress Notes (Signed)
   03/27/20 0810  Assess: MEWS Score  Temp 98 F (36.7 C)  Level of Consciousness Alert  Assess: MEWS Score  MEWS Temp 0  MEWS Systolic 0  MEWS Pulse 1  MEWS RR 0  MEWS LOC 0  MEWS Score 1  MEWS Score Color Green  Assess: if the MEWS score is Yellow or Red  Were vital signs taken at a resting state? No  Focused Assessment No change from prior assessment  Early Detection of Sepsis Score *See Row Information* High  MEWS guidelines implemented *See Row Information* No, altered LOC is baseline  Treat  MEWS Interventions Administered prn meds/treatments  Pain Scale Faces  Pain Score 4  Faces Pain Scale 4  Notify: Charge Nurse/RN  Name of Charge Nurse/RN Notified Ana, RN  Rechecked temp with another device, within normal range

## 2020-03-27 NOTE — Progress Notes (Signed)
Pt pulled off dressing once again. Medication given at 12 not effective

## 2020-03-27 NOTE — Evaluation (Signed)
Physical Therapy Evaluation Patient Details Name: Kayla Fowler MRN: [id number] DOB: October 19, 1926 Today's Date: 03/27/2020   History of Present Illness  86 y/o female suffered an unwitnessed fall with L hip fracture and subsequent hemiarthroplasty  Clinical Impression  Pt extremely confused t/o the session and though she was able to give some effort with plenty of cuing, etc she was unable to show any safety of situational awareness.  Overall was unable to comprehend that she had broken her hip and all attempts at educating on hip precautions were futile.  Pt c/o pain with essentially all L LE movement, but did surprisingly well with consistent cuing despite needing AAROM ROM for most exercises and heavy assist with all mobility.   Follow Up Recommendations Supervision/Assistance - 24 hour;SNF    Equipment Recommendations  Other (comment) (TBD at next venue of care)    Recommendations for Other Services       Precautions / Restrictions Restrictions Weight Bearing Restrictions: Yes LLE Weight Bearing: Partial weight bearing      Mobility  Bed Mobility Overal bed mobility: Needs Assistance Bed Mobility: Supine to Sit;Sit to Supine     Supine to sit: Max assist Sit to supine: Max assist   General bed mobility comments: tolerated transition to sitting better than expected (from pain stand-point) but offered little in terms of assisting with transition  Transfers Overall transfer level: Needs assistance Equipment used: Rolling walker (2 wheeled) Transfers: Sit to/from Stand Sit to Stand: Max assist            Ambulation/Gait             General Gait Details: unable/inappropriate  Information systems manager Rankin (Stroke Patients Only)       Balance Overall balance assessment: Needs assistance Sitting-balance support: Bilateral upper extremity supported Sitting balance-Leahy Scale: Poor Sitting balance - Comments: Pt with  inconsistent ability to maintain sitting EOB     Standing balance-Leahy Scale: Poor Standing balance comment: constant assist to maintain standing with walker                             Pertinent Vitals/Pain Pain Assessment: Faces Faces Pain Scale: Hurts whole lot Pain Location: L hip    Home Living Family/patient expects to be discharged to:: Unsure                 Additional Comments: Pt unable to answer, but appears she is at family care/group home situation    Prior Function Level of Independence: Needs assistance         Comments: Pt extremely confused, unable to report how much/what she did but presumably she needed assist with most aspects of care secondary to cognition     Hand Dominance        Extremity/Trunk Assessment   Upper Extremity Assessment Upper Extremity Assessment: Generalized weakness;Difficult to assess due to impaired cognition    Lower Extremity Assessment Lower Extremity Assessment: Generalized weakness;Difficult to assess due to impaired cognition (pain limited with L hip)       Communication   Communication: No difficulties  Cognition Arousal/Alertness: Awake/alert Behavior During Therapy: Anxious;Restless Overall Cognitive Status: History of cognitive impairments - at baseline  General Comments: unsure of actual baseline, but pt profoundly confused t/o session      General Comments      Exercises Total Joint Exercises Ankle Circles/Pumps: AROM;AAROM;10 reps Quad Sets: AROM;10 reps Short Arc Quad: 10 reps;AAROM Heel Slides: AAROM;5 reps Hip ABduction/ADduction: AAROM;AROM;10 reps   Assessment/Plan    PT Assessment Patient needs continued PT services  PT Problem List Decreased range of motion;Decreased strength;Decreased activity tolerance;Decreased balance;Decreased mobility;Decreased cognition;Decreased knowledge of use of DME;Decreased safety  awareness;Pain       PT Treatment Interventions Gait training;DME instruction;Functional mobility training;Therapeutic activities;Balance training;Therapeutic exercise;Cognitive remediation;Patient/family education    PT Goals (Current goals can be found in the Care Plan section)  Acute Rehab PT Goals Patient Stated Goal: unable to confirm PT Goal Formulation: Patient unable to participate in goal setting Time For Goal Achievement: 04/10/20 Potential to Achieve Goals: Fair    Frequency 7X/week   Barriers to discharge        Co-evaluation               AM-PAC PT "6 Clicks" Mobility  Outcome Measure Help needed turning from your back to your side while in a flat bed without using bedrails?: Total Help needed moving from lying on your back to sitting on the side of a flat bed without using bedrails?: Total Help needed moving to and from a bed to a chair (including a wheelchair)?: Total Help needed standing up from a chair using your arms (e.g., wheelchair or bedside chair)?: Total Help needed to walk in hospital room?: Total Help needed climbing 3-5 steps with a railing? : Total 6 Click Score: 6    End of Session Equipment Utilized During Treatment: Gait belt Activity Tolerance:  (limited due to mental status) Patient left: with chair alarm set Nurse Communication: Mobility status PT Visit Diagnosis: Muscle weakness (generalized) (M62.81);Difficulty in walking, not elsewhere classified (R26.2);Pain Pain - Right/Left: Left Pain - part of body: Hip    Time: 0814-4818 PT Time Calculation (min) (ACUTE ONLY): 31 min   Charges:   PT Evaluation $PT Eval Low Complexity: 1 Low PT Treatments $Therapeutic Exercise: 8-22 mins $Therapeutic Activity: 8-22 mins        Malachi Pro, DPT 03/27/2020, 12:51 PM

## 2020-03-27 NOTE — Progress Notes (Signed)
PROGRESS NOTE    Patient: Kayla Fowler                            PCP: Theodosia Blender, MD                    DOB: 1926/10/27            DOA: 03/26/2020 ZOX:[id number]             DOS: 03/27/2020, 1:46 PM   LOS: 1 day   Date of Service: The patient was seen and examined on 03/27/2020  Subjective:   Postop day #1 Status post ORIF left hip  This morning patient was fine confused, agitated pulling on her dressing, IV lines. Low-grade fever 100.7, due to agitation tachycardic on 126, respiratory rate 20 blood pressure 134/82 satting 96% on room air.  She does not appear to have any discomfort from pain, does not appear to be in respiratory distress.   Brief Narrative:   HPI: Kayla Fowler is a 86 y.o. female with medical history significant for dementia who presents following an unwitnessed fall with complaints of pain to the left hip.  History limited due to dementia...   ED Course:  Hemodynamically stable,, labs within normal limits,  Troponin 14.  Patient had extensive imaging studies including CT head, C-spine, hip and chest.  X-rays of hip showed transcervical left femoral neck fracture with marked varus angulation.  Orthopedist Dr. Hyacinth Meeker consulted.   Assessment & Plan:   Principal Problem:   Left displaced femoral neck fracture (HCC) Active Problems:   Dementia without behavioral disturbance (HCC)   Accidental fall   Preoperative clearance   Essential hypertension   Primary osteoarthritis involving multiple joints   Protein-calorie malnutrition, severe    Left displaced femoral neck fracture (HCC)/ Accidental fall -Postop day #1 -Status post ORIF left hip  -Tolerated procedure well -We will continue pain management  -Continue gentle IV fluid hydration -Further orders per orthopedics (Dr. Hyacinth Meeker)  Low-grade fever -Low-grade fever 100.7 this a.m. -Pre-/postop Ancef -we will continue for today to -Obtaining blood cultures, UA with reflex culture  sensitivity    Dementia without behavioral disturbance (HCC)-associated confusion agitation this a.m. We will utilize as needed Haldol -Ruling out underlying infection     Essential hypertension -Hydralazine as needed while n.p.o. -Stable    Primary osteoarthritis involving multiple joints -Pain management as above -Remained stable    Antimicrobials:  Preoperative antibiotics   Consultants: Ortho. Dr. Hyacinth Meeker   ---------------------------------------------------------------------------------------------------------------------------------------------  DVT prophylaxis:  SCD/Compression stockings Code Status:   Code Status: Full Code Family Communication: No family member present at bedside- attempt will be made to update daily The above findings and plan of care has been discussed with patient (and family )  in detail,  -Advance care planning has been discussed.   Admission status:    Status is: Inpatient  Remains inpatient appropriate because:Inpatient level of care appropriate due to severity of illness   Dispo: The patient is from: ALF              Anticipated d/c is to: SNF              Anticipated d/c date is: 3 days              Patient currently is not medically stable to d/c.         Procedures:   No admission procedures for hospital encounter.  Antimicrobials:  Anti-infectives (From admission, onward)   Start     Dose/Rate Route Frequency Ordered Stop   03/27/20 1400  ceFAZolin (ANCEF) IVPB 2g/100 mL premix        2 g 200 mL/hr over 30 Minutes Intravenous Every 8 hours 03/27/20 0823 03/28/20 0559   03/27/20 0100  clindamycin (CLEOCIN) IVPB 600 mg        600 mg 100 mL/hr over 30 Minutes Intravenous Every 8 hours 03/26/20 2301 03/27/20 1243   03/27/20 0100  ceFAZolin (ANCEF) IVPB 2g/100 mL premix        2 g 200 mL/hr over 30 Minutes Intravenous Every 8 hours 03/26/20 2359 03/27/20 0616   03/26/20 2315  ceFAZolin (ANCEF) IVPB 2g/100 mL  premix  Status:  Discontinued        2 g 200 mL/hr over 30 Minutes Intravenous Every 8 hours 03/26/20 2301 03/26/20 2359   03/26/20 1545  clindamycin (CLEOCIN) 600 MG/50ML IVPB       Note to Pharmacy: Desma Paganinioss, Mary   : cabinet override      03/26/20 1545 03/26/20 1746   03/26/20 1545  ceFAZolin (ANCEF) 2-4 GM/100ML-% IVPB       Note to Pharmacy: Desma Paganinioss, Mary   : cabinet override      03/26/20 1545 03/26/20 1747   03/26/20 0600  ceFAZolin (ANCEF) IVPB 2g/100 mL premix        2 g 200 mL/hr over 30 Minutes Intravenous On call to O.R. 03/26/20 0501 03/26/20 1734   03/26/20 0600  clindamycin (CLEOCIN) IVPB 600 mg        600 mg 100 mL/hr over 30 Minutes Intravenous On call to O.R. 03/26/20 0501 03/26/20 1721       Medication:  . acetaminophen  1,000 mg Oral Q8H  . chlorhexidine  1 application Topical Once  . Chlorhexidine Gluconate Cloth  6 each Topical Q0600  . cholecalciferol  2,000 Units Oral Daily  . docusate sodium  100 mg Oral BID  . DULoxetine  40 mg Oral Daily  . enoxaparin (LOVENOX) injection  40 mg Subcutaneous Q24H  . ferrous sulfate  325 mg Oral Q breakfast  . melatonin  2.5 mg Oral QHS  . mirtazapine  7.5 mg Oral QHS  . multivitamin with minerals  1 tablet Oral Daily  . mupirocin ointment  1 application Nasal BID    acetaminophen, alum & mag hydroxide-simeth, bisacodyl, haloperidol lactate, HYDROcodone-acetaminophen, HYDROcodone-acetaminophen, loperamide, menthol-cetylpyridinium **OR** phenol, methocarbamol **OR** methocarbamol (ROBAXIN) IV, metoCLOPramide **OR** metoCLOPramide (REGLAN) injection, morphine injection, morphine injection, morphine injection, ondansetron **OR** ondansetron (ZOFRAN) IV, senna, sodium phosphate, zolpidem   Objective:   Vitals:   03/27/20 0805 03/27/20 0810 03/27/20 0826 03/27/20 1301  BP: (!) 152/78   135/82  Pulse: (!) 108   (!) 126  Resp: 18   20  Temp: (!) 100.7 F (38.2 C) 98 F (36.7 C) 98 F (36.7 C) 99 F (37.2 C)  TempSrc: Oral    Oral  SpO2: 100%   96%  Weight:      Height:        Intake/Output Summary (Last 24 hours) at 03/27/2020 1346 Last data filed at 03/27/2020 1004 Gross per 24 hour  Intake 1120 ml  Output 325 ml  Net 795 ml   Filed Weights   03/26/20 0311 03/26/20 1654  Weight: 63.5 kg 63.5 kg     Examination:      Physical Exam:   General:   Agitated, confused this a.m.  HEENT:  Normocephalic, PERRL, otherwise with in Normal limits   Neuro:  CNII-XII intact. , normal motor and sensation, reflexes intact   Lungs:   Clear to auscultation BL, Respirations unlabored, no wheezes / crackles  Cardio:    S1/S2, RRR, No murmure, No Rubs or Gallops   Abdomen:   Soft, non-tender, bowel sounds active all four quadrants,  no guarding or peritoneal signs.  Muscular skeletal:  Limited exam - in bed, able to move all 4 extremities, Normal strength,  2+ pulses,  symmetric, No pitting edema, left hip surgical wound clean, dressing in place  Skin:  Dry, warm to touch, negative for any Rashes, left hip surgical wound.  Wounds: Please see nursing documentation            ------------------------------------------------------------------------------------------------------------------------------------------    LABs:  CBC Latest Ref Rng & Units 03/27/2020 03/26/2020 10/18/2019  WBC 4.0 - 10.5 K/uL 7.2 10.6(H) 5.8  Hemoglobin 12.0 - 15.0 g/dL 10.8(L) 12.2 13.2  Hematocrit 36 - 46 % 33.2(L) 37.1 41.2  Platelets 150 - 400 K/uL 215 265 333   CMP Latest Ref Rng & Units 03/27/2020 03/26/2020 10/18/2019  Glucose 70 - 99 mg/dL 161(W) 960(A) 540(J)  BUN 8 - 23 mg/dL 17 23 81(X)  Creatinine 0.44 - 1.00 mg/dL 9.14 7.82 9.56  Sodium 135 - 145 mmol/L 134(L) 139 139  Potassium 3.5 - 5.1 mmol/L 3.4(L) 3.9 4.2  Chloride 98 - 111 mmol/L 97(L) 101 99  CO2 22 - 32 mmol/L Calcium 8.9 - 10.3 mg/dL 8.3(L) 9.3 9.6  Total Protein 6.5 - 8.1 g/dL - 7.3 -  Total Bilirubin 0.3 - 1.2 mg/dL - 1.0 -  Alkaline Phos 38 -  126 U/L - 62 -  AST 15 - 41 U/L - 31 -  ALT 0 - 44 U/L - 16 -       Micro Results Recent Results (from the past 240 hour(s))  SARS Coronavirus 2 by RT PCR (hospital order, performed in [facility] hospital lab) Nasopharyngeal Nasopharyngeal Swab     Status: None   Collection Time: 03/26/20  6:18 AM   Specimen: Nasopharyngeal Swab  Result Value Ref Range Status   SARS Coronavirus 2 NEGATIVE NEGATIVE Final    Comment: (NOTE) SARS-CoV-2 target nucleic acids are NOT DETECTED.  The SARS-CoV-2 RNA is generally detectable in upper and lower respiratory specimens during the acute phase of infection. The lowest concentration of SARS-CoV-2 viral copies this assay can detect is 250 copies / mL. A negative result does not preclude SARS-CoV-2 infection and should not be used as the sole basis for treatment or other patient management decisions.  A negative result may occur with improper specimen collection / handling, submission of specimen other than nasopharyngeal swab, presence of viral mutation(s) within the areas targeted by this assay, and inadequate number of viral copies (<250 copies / mL). A negative result must be combined with clinical observations, patient history, and epidemiological information.  Fact Sheet for Patients:   BoilerBrush.com.cy  Fact Sheet for Healthcare Providers: https://pope.com/  This test is not yet approved or  cleared by the [place] FDA and has been authorized for detection and/or diagnosis of SARS-CoV-2 by FDA under an Emergency Use Authorization (EUA).  This EUA will remain in effect (meaning this test can be used) for the duration of the COVID-19 declaration under Section 564(b)(1) of the Act, 21 U.S.C. section 360bbb-3(b)(1), unless the authorization is terminated or revoked sooner.  Performed at [facility], [address], [place], [place] [address]   Surgical pcr screen      Status: Abnormal   Collection Time: 03/26/20  8:48 AM   Specimen: Nasal Mucosa; Nasal Swab  Result Value Ref Range Status   MRSA, PCR POSITIVE (A) NEGATIVE Final    Comment: RESULT CALLED TO, READ BACK BY AND VERIFIED WITH: JUN GUIRAL ON 03/26/20 AT 1014 QSD    Staphylococcus aureus POSITIVE (A) NEGATIVE Final    Comment: (NOTE) The Xpert SA Assay (FDA approved for NASAL specimens in patients 65 years of age and older), is one component of a comprehensive surveillance program. It is not intended to diagnose infection nor to guide or monitor treatment. Performed at [facility], [address], [place], [place] [address]     Radiology Reports DG Chest 1 View  Result Date: 03/26/2020 CLINICAL DATA:  86 year old female with fall and left knee pain. EXAM: CHEST  1 VIEW COMPARISON:  Chest radiograph dated 10/18/2019 and CT dated 10/18/2019 FINDINGS: No focal consolidation, pleural effusion, or pneumothorax. There is chronic interstitial coarsening and bronchitic changes. Mild cardiomegaly. Atherosclerotic calcification of the aorta. Osteopenia with degenerative changes of the spine. T11 compression fracture as seen previously. No acute osseous pathology. Left shoulder arthroplasty. IMPRESSION: 1. No acute cardiopulmonary process. 2. Mild cardiomegaly. Electronically Signed   By: Elgie Collard M.D.   On: 03/26/2020 03:46   CT Head Wo Contrast  Result Date: 03/26/2020 CLINICAL DATA:  Unwitnessed fall. EXAM: CT HEAD WITHOUT CONTRAST CT CERVICAL SPINE WITHOUT CONTRAST TECHNIQUE: Multidetector CT imaging of the head and cervical spine was performed following the standard protocol without intravenous contrast. Multiplanar CT image reconstructions of the cervical spine were also generated. COMPARISON:  CT head and cervical spine 05/13/2019 FINDINGS: CT HEAD FINDINGS Brain: Atrophy and white matter changes are stable. Remote infarct of the left parietal lobe is stable. Chronic ischemic  changes are evident in the thalami bilaterally and extending into the brainstem. The ventricles are proportionate to the degree of atrophy. No significant extraaxial fluid collection is present. The brainstem and cerebellum are otherwise within normal limits. Vascular: Atherosclerotic calcifications within the cavernous internal carotid arteries and vertebral arteries at the dural margin bilaterally are stable. No hyperdense vessel is present. Skull: Calvarium is intact. No focal lytic or blastic lesions are present. No significant extracranial soft tissue lesion is present. Sinuses/Orbits: The paranasal sinuses and mastoid air cells are clear. Bilateral lens replacements are noted. Globes and orbits are otherwise unremarkable. CT CERVICAL SPINE FINDINGS Alignment: Grade 1 degenerative anterolisthesis at C3-4, C4-5, C7-T1, and T1-2 is stable. Skull base and vertebrae: Degenerative changes are present C1-2. Craniocervical junction is otherwise normal. No acute abnormalities are present. Soft tissues and spinal canal: No prevertebral fluid or swelling. No visible canal hematoma. Disc levels: Right foraminal narrowing at C3-4 C4-5 and left foraminal narrowing at C5-6 is stable. No new or significantly progressive disc disease is present. Central canal stenosis is most severe at C5-6, stable Upper chest: Scarring at the right lung apex is stable. Thoracic inlet is within normal limits. IMPRESSION: 1. No acute trauma to the head or cervical spine. 2. Stable atrophy and white matter disease. 3. Stable multilevel degenerative changes of the cervical spine as described. Electronically Signed   By: Marin Roberts M.D.   On: 03/26/2020 04:34   CT Cervical Spine Wo Contrast  Result Date: 03/26/2020 CLINICAL DATA:  Unwitnessed fall. EXAM: CT HEAD WITHOUT CONTRAST CT CERVICAL SPINE WITHOUT CONTRAST TECHNIQUE: Multidetector CT imaging of the  head and cervical spine was performed following the standard protocol without  intravenous contrast. Multiplanar CT image reconstructions of the cervical spine were also generated. COMPARISON:  CT head and cervical spine 05/13/2019 FINDINGS: CT HEAD FINDINGS Brain: Atrophy and white matter changes are stable. Remote infarct of the left parietal lobe is stable. Chronic ischemic changes are evident in the thalami bilaterally and extending into the brainstem. The ventricles are proportionate to the degree of atrophy. No significant extraaxial fluid collection is present. The brainstem and cerebellum are otherwise within normal limits. Vascular: Atherosclerotic calcifications within the cavernous internal carotid arteries and vertebral arteries at the dural margin bilaterally are stable. No hyperdense vessel is present. Skull: Calvarium is intact. No focal lytic or blastic lesions are present. No significant extracranial soft tissue lesion is present. Sinuses/Orbits: The paranasal sinuses and mastoid air cells are clear. Bilateral lens replacements are noted. Globes and orbits are otherwise unremarkable. CT CERVICAL SPINE FINDINGS Alignment: Grade 1 degenerative anterolisthesis at C3-4, C4-5, C7-T1, and T1-2 is stable. Skull base and vertebrae: Degenerative changes are present C1-2. Craniocervical junction is otherwise normal. No acute abnormalities are present. Soft tissues and spinal canal: No prevertebral fluid or swelling. No visible canal hematoma. Disc levels: Right foraminal narrowing at C3-4 C4-5 and left foraminal narrowing at C5-6 is stable. No new or significantly progressive disc disease is present. Central canal stenosis is most severe at C5-6, stable Upper chest: Scarring at the right lung apex is stable. Thoracic inlet is within normal limits. IMPRESSION: 1. No acute trauma to the head or cervical spine. 2. Stable atrophy and white matter disease. 3. Stable multilevel degenerative changes of the cervical spine as described. Electronically Signed   By: Marin Roberts M.D.   On:  03/26/2020 04:34   DG Hip Port Unilat With Pelvis 1V Left  Result Date: 03/26/2020 CLINICAL DATA:  Left hip arthroplasty EXAM: DG HIP (WITH OR WITHOUT PELVIS) 1V PORT LEFT COMPARISON:  03/26/2020 FINDINGS: Interval left hip arthroplasty. No acute hardware complication. Osteopenia. Remote right hip arthroplasty. IMPRESSION: Expected appearance after interval left hip arthroplasty. Electronically Signed   By: Jeronimo Greaves M.D.   On: 03/26/2020 19:45   DG Hip Unilat W or Wo Pelvis 2-3 Views Left  Result Date: 03/26/2020 CLINICAL DATA:  Fall, left hip pain EXAM: DG HIP (WITH OR WITHOUT PELVIS) 2-3V LEFT COMPARISON:  None. FINDINGS: Single view radiograph pelvis and two view radiograph left hip demonstrates a transcervical left femoral neck fracture with marked varus angulation of the distal fracture fragment. Femoral head appears seated within the left acetabulum but demonstrates marked internal rotation. Left hip joint space appears preserved. Pelvis and sacrum appear intact. Right hip bipolar hemiarthroplasty has been performed. Degenerative changes are seen within the lumbar spine. IMPRESSION: Transcervical left femoral neck fracture with marked varus angulation. Electronically Signed   By: Helyn Numbers MD   On: 03/26/2020 03:48   DG Femur Portable Min 2 Views Left  Result Date: 03/26/2020 CLINICAL DATA:  Fall, left hip pain EXAM: LEFT FEMUR PORTABLE 2 VIEWS COMPARISON:  None. FINDINGS: Two view radiograph of the left femur demonstrates a transcervical femoral neck fracture of the left hip with marked varus angulation of the distal fracture fragment. The femoral head is still seated within the left acetabulum and the left hip joint space appears preserved. No other fracture or dislocation identified. IMPRESSION: Transcervical left femoral neck fracture with marked varus angulation. Electronically Signed   By: Helyn Numbers MD   On: 03/26/2020 03:47  SIGNED: Kendell Bane, MD, FACP, FHM. [facility],  Pager (please use [email or web address] to page/text)  If 7PM-7AM, please contact night-coverage [email or web address] 03/27/2020, 1:46 PM

## 2020-03-27 NOTE — NC FL2 (Signed)
[place] MEDICAID FL2 LEVEL OF CARE SCREENING TOOL     IDENTIFICATION  Patient Name: Kayla Fowler Birthdate: 22-Dec-1926 Sex: female Admission Date (Current Location): 03/26/2020  [facility] and [place] Number:  Chiropodist and Address:  [facility], [address], [place], [place] [address]      Provider Number: [id number]  Attending Physician Name and Address:  Kendell Bane, MD  Relative Name and Phone Number:  [facility] [phone]    Current Level of Care: Hospital Recommended Level of Care: Skilled Nursing Facility Prior Approval Number:    Date Approved/Denied:   PASRR Number: pending  Discharge Plan: SNF    Current Diagnoses: Patient Active Problem List   Diagnosis Date Noted   Left displaced femoral neck fracture (HCC) 03/26/2020   Dementia without behavioral disturbance (HCC) 03/26/2020   Accidental fall 03/26/2020   Preoperative clearance 03/26/2020   Essential hypertension 03/26/2020   Protein-calorie malnutrition, severe 03/26/2020   Primary osteoarthritis involving multiple joints 12/29/2018   Vascular dementia without behavioral disturbance (HCC) 09/23/2016    Orientation RESPIRATION BLADDER Height & Weight     Self  Normal Incontinent Weight: 63.5 kg Height:  5\' 6"  (167.6 cm)  BEHAVIORAL SYMPTOMS/MOOD NEUROLOGICAL BOWEL NUTRITION STATUS      Incontinent Diet (regular)  AMBULATORY STATUS COMMUNICATION OF NEEDS Skin     Verbally Surgical wounds                       Personal Care Assistance Level of Assistance  Bathing, Feeding, Dressing Bathing Assistance: Limited assistance Feeding assistance: Limited assistance Dressing Assistance: Limited assistance     Functional Limitations Info  Sight, Hearing Sight Info: Impaired Hearing Info: Impaired      SPECIAL CARE FACTORS FREQUENCY  PT (By licensed PT), OT (By licensed OT)     PT Frequency: 5 times per week OT Frequency: 3 times  per week            Contractures Contractures Info: Not present    Additional Factors Info  Code Status, Allergies Code Status Info: full code Allergies Info: Ibuprofen, Neomycin, Percocet Oxycodone-acetaminophen, Triple Antibiotic           Current Medications (03/27/2020):  This is the current hospital active medication list Current Facility-Administered Medications  Medication Dose Route Frequency Provider Last Rate Last Admin   0.45 % sodium chloride infusion   Intravenous Continuous 03/29/2020, MD 75 mL/hr at 03/27/20 0024 New Bag at 03/27/20 0024   0.9 %  sodium chloride infusion   Intravenous Continuous 03/29/20, MD   Stopped at 03/26/20 0836   0.9 %  sodium chloride infusion   Intravenous Continuous 03/28/20, MD   Stopped at 03/26/20 1855   acetaminophen (TYLENOL) tablet 1,000 mg  1,000 mg Oral 03/28/20, MD   1,000 mg at 03/27/20 0751   acetaminophen (TYLENOL) tablet 325-650 mg  325-650 mg Oral Q6H PRN 03/29/20, MD       alum & mag hydroxide-simeth (MAALOX/MYLANTA) 200-200-20 MG/5ML suspension 30 mL  30 mL Oral Q4H PRN 02-10-2001, MD       bisacodyl (DULCOLAX) suppository 10 mg  10 mg Rectal Daily PRN Deeann Saint, MD       ceFAZolin (ANCEF) IVPB 2g/100 mL premix  2 g Intravenous Q8H Shahmehdi, Seyed A, MD       chlorhexidine (HIBICLENS) 4 % liquid 1 application  1 application Topical Once Deeann Saint, MD  Chlorhexidine Gluconate Cloth 2 % PADS 6 each  6 each Topical Q0600 Deeann Saint, MD   6 each at 03/26/20 1241   cholecalciferol (VITAMIN D3) tablet 2,000 Units  2,000 Units Oral Daily Deeann Saint, MD   2,000 Units at 03/27/20 1208   docusate sodium (COLACE) capsule 100 mg  100 mg Oral BID Deeann Saint, MD   100 mg at 03/27/20 1207   DULoxetine (CYMBALTA) DR capsule 40 mg  40 mg Oral Daily Deeann Saint, MD   40 mg at 03/27/20 1208   enoxaparin (LOVENOX) injection 40 mg  40 mg Subcutaneous Q24H Shahmehdi,  Seyed A, MD   40 mg at 03/27/20 0802   ferrous sulfate tablet 325 mg  325 mg Oral Q breakfast Deeann Saint, MD   325 mg at 03/27/20 0751   haloperidol lactate (HALDOL) injection 1 mg  1 mg Intravenous Q6H PRN Nevin Bloodgood A, MD   1 mg at 03/27/20 1206   HYDROcodone-acetaminophen (NORCO/VICODIN) 5-325 MG per tablet 1 tablet  1 tablet Oral Q6H PRN Deeann Saint, MD       HYDROcodone-acetaminophen (NORCO/VICODIN) 5-325 MG per tablet 1-2 tablet  1-2 tablet Oral Q6H PRN Deeann Saint, MD       loperamide (IMODIUM) capsule 2-4 mg  2-4 mg Oral QID PRN Deeann Saint, MD       melatonin tablet 2.5 mg  2.5 mg Oral QHS Deeann Saint, MD   2.5 mg at 03/26/20 2252   menthol-cetylpyridinium (CEPACOL) lozenge 3 mg  1 lozenge Oral PRN Deeann Saint, MD       Or   phenol (CHLORASEPTIC) mouth spray 1 spray  1 spray Mouth/Throat PRN Deeann Saint, MD       methocarbamol (ROBAXIN) tablet 500 mg  500 mg Oral Q6H PRN Deeann Saint, MD   500 mg at 03/27/20 6226   Or   methocarbamol (ROBAXIN) 500 mg in dextrose 5 % 50 mL IVPB  500 mg Intravenous Q6H PRN Deeann Saint, MD       metoCLOPramide (REGLAN) tablet 5-10 mg  5-10 mg Oral Q8H PRN Deeann Saint, MD       Or   metoCLOPramide (REGLAN) injection 5-10 mg  5-10 mg Intravenous Q8H PRN Deeann Saint, MD       mirtazapine (REMERON) tablet 7.5 mg  7.5 mg Oral QHS Deeann Saint, MD   7.5 mg at 03/26/20 2253   morphine 2 MG/ML injection 0.5 mg  0.5 mg Intravenous Q2H PRN Deeann Saint, MD       morphine 2 MG/ML injection 1 mg  1 mg Intravenous Q2H PRN Deeann Saint, MD   1 mg at 03/27/20 0542   morphine 2 MG/ML injection 1 mg  1 mg Intravenous Q2H PRN Deeann Saint, MD       multivitamin with minerals tablet 1 tablet  1 tablet Oral Daily Deeann Saint, MD   1 tablet at 03/27/20 1208   mupirocin ointment (BACTROBAN) 2 % 1 application  1 application Nasal BID Deeann Saint, MD   1 application at 03/27/20 1209   ondansetron (ZOFRAN)  tablet 4 mg  4 mg Oral Q6H PRN Deeann Saint, MD       Or   ondansetron [facility]) injection 4 mg  4 mg Intravenous Q6H PRN Deeann Saint, MD       senna (SENOKOT) tablet 8.6 mg  1 tablet Oral QHS PRN Deeann Saint, MD       sodium phosphate (FLEET) 7-19 GM/118ML enema 1 enema  1 enema  Rectal Once PRN Deeann Saint, MD       zolpidem Remus Loffler) tablet 5 mg  5 mg Oral QHS PRN Deeann Saint, MD         Discharge Medications: Please see discharge summary for a list of discharge medications.  Relevant Imaging Results:  Relevant Lab Results:   Additional Information [id number] SS#  Barrie Dunker, RN

## 2020-03-27 NOTE — Progress Notes (Signed)
Pt has removed surgical dressing x 3.Replaced x 3 and placed ace wrap for deterring removal. Pt made several attempts to remove IV, and continues to remove O2.

## 2020-03-28 DIAGNOSIS — I10 Essential (primary) hypertension: Secondary | ICD-10-CM

## 2020-03-28 DIAGNOSIS — E43 Unspecified severe protein-calorie malnutrition: Secondary | ICD-10-CM

## 2020-03-28 DIAGNOSIS — M8949 Other hypertrophic osteoarthropathy, multiple sites: Secondary | ICD-10-CM

## 2020-03-28 DIAGNOSIS — F0280 Dementia in other diseases classified elsewhere without behavioral disturbance: Secondary | ICD-10-CM

## 2020-03-28 DIAGNOSIS — Z01818 Encounter for other preprocedural examination: Secondary | ICD-10-CM

## 2020-03-28 DIAGNOSIS — G309 Alzheimer's disease, unspecified: Secondary | ICD-10-CM

## 2020-03-28 DIAGNOSIS — W19XXXA Unspecified fall, initial encounter: Secondary | ICD-10-CM

## 2020-03-28 LAB — BASIC METABOLIC PANEL
Anion gap: 9 (ref 5–15)
BUN: 23 mg/dL (ref 8–23)
CO2: 27 mmol/L (ref 22–32)
Calcium: 8 mg/dL — ABNORMAL LOW (ref 8.9–10.3)
Chloride: 100 mmol/L (ref 98–111)
Creatinine, Ser: 0.92 mg/dL (ref 0.44–1.00)
GFR calc Af Amer: >60 mL/min (ref >60)
GFR calc non Af Amer: 54 mL/min — ABNORMAL LOW (ref >60)
Glucose, Bld: 115 mg/dL — ABNORMAL HIGH (ref 70–99)
Potassium: 4.1 mmol/L (ref 3.5–5.1)
Sodium: 136 mmol/L (ref 135–145)

## 2020-03-28 LAB — URINALYSIS, COMPLETE (UACMP) WITH MICROSCOPIC
Bacteria, UA: NONE SEEN
Bilirubin Urine: NEGATIVE
Glucose, UA: NEGATIVE mg/dL
Hgb urine dipstick: NEGATIVE
Ketones, ur: 20 mg/dL — AB
Leukocytes,Ua: NEGATIVE
Nitrite: NEGATIVE
Protein, ur: 30 mg/dL — AB
Specific Gravity, Urine: 1.033 — ABNORMAL HIGH (ref 1.005–1.030)
pH: 5 (ref 5.0–8.0)

## 2020-03-28 LAB — CBC
HCT: 25.1 % — ABNORMAL LOW (ref 36.0–46.0)
Hemoglobin: 8.3 g/dL — ABNORMAL LOW (ref 12.0–15.0)
MCH: 32.3 pg (ref 26.0–34.0)
MCHC: 33.1 g/dL (ref 30.0–36.0)
MCV: 97.7 fL (ref 80.0–100.0)
Platelets: 186 10*3/uL (ref 150–400)
RBC: 2.57 MIL/uL — ABNORMAL LOW (ref 3.87–5.11)
RDW: 13 % (ref 11.5–15.5)
WBC: 13.2 10*3/uL — ABNORMAL HIGH (ref 4.0–10.5)
nRBC: 0 % (ref 0.0–0.2)

## 2020-03-28 MED ORDER — DEXTROSE IN LACTATED RINGERS 5 % IV SOLN: INTRAVENOUS | Status: DC

## 2020-03-28 NOTE — Progress Notes (Signed)
Subjective: 2 Days Post-Op Procedure(s) (LRB): ARTHROPLASTY BIPOLAR HIP (HEMIARTHROPLASTY) (Left)   Patient somnolent today.  Still fighting the nurses when awake.  Dressing dry and intact.  Hemoglobin 8.3.  Working on Product manager. Patient reports pain as mild.  Objective:   VITALS:   Vitals:   03/28/20 0521 03/28/20 0830  BP: 118/69 (!) 100/47  Pulse: (!) 109 85  Resp: 19 18  Temp: 99.3 F (37.4 C) 97.8 F (36.6 C)  SpO2: 93% 98%    Neurologically intact Neurovascular intact Sensation intact distally Intact pulses distally Dorsiflexion/Plantar flexion intact  LABS Recent Labs    03/26/20 0316 03/27/20 0835 03/28/20 0614  HGB 12.2 10.8* 8.3*  HCT 37.1 33.2* 25.1*  WBC 10.6* 7.2 13.2*  PLT 265 215 186    Recent Labs    03/26/20 0316 03/27/20 0835 03/28/20 0614  NA 139 134* 136  K 3.9 3.4* 4.1  BUN 23 17 23   CREATININE 0.81 0.72 0.92  GLUCOSE 125* 112* 115*    Recent Labs    03/26/20 0316  INR 0.9     Assessment/Plan: 2 Days Post-Op Procedure(s) (LRB): ARTHROPLASTY BIPOLAR HIP (HEMIARTHROPLASTY) (Left)   Advance diet Up with therapy Discharge to SNF

## 2020-03-28 NOTE — Progress Notes (Signed)
Physical Therapy Treatment Patient Details Name: Kayla Fowler MRN: [id number] DOB: 06-23-1927 Today's Date: 03/28/2020    History of Present Illness 86 y/o female suffered an unwitnessed fall with L hip fracture and subsequent hemiarthroplasty    PT Comments    Pt appears comfortable at rest.  She allows P/AAROM RLE x 10.  On attempts at LLE pt resists all movement reaching up stating "Please don't hurt me."  Over and over.  Attempts at movement stopped and pt returns to peaceful rest.  Cognition and pain remain primary barriers for progression of mobility.   Follow Up Recommendations  SNF     Equipment Recommendations       Recommendations for Other Services       Precautions / Restrictions Precautions Precautions: Posterior Hip Restrictions Weight Bearing Restrictions: Yes LLE Weight Bearing: Partial weight bearing    Mobility  Bed Mobility                  Transfers                    Ambulation/Gait                 Stairs             Wheelchair Mobility    Modified Rankin (Stroke Patients Only)       Balance                                            Cognition Arousal/Alertness: Awake/alert Behavior During Therapy: Anxious;Restless Overall Cognitive Status: History of cognitive impairments - at baseline                                 General Comments: unsure of actual baseline, but pt profoundly confused t/o session      Exercises Total Joint Exercises Ankle Circles/Pumps: AROM;AAROM;10 reps;Both Heel Slides: AAROM;10 reps;Right Hip ABduction/ADduction: 10 reps;Right;AAROM Straight Leg Raises: AAROM;Right;10 reps    General Comments        Pertinent Vitals/Pain Pain Assessment: Faces Faces Pain Scale: Hurts whole lot Pain Location: L hip Pain Descriptors / Indicators: Sore;Grimacing Pain Intervention(s): Limited activity within patient's tolerance;Monitored during session     Home Living                      Prior Function            PT Goals (current goals can now be found in the care plan section) Progress towards PT goals: Progressing toward goals    Frequency    7X/week      PT Plan Current plan remains appropriate    Co-evaluation              AM-PAC PT "6 Clicks" Mobility   Outcome Measure  Help needed turning from your back to your side while in a flat bed without using bedrails?: Total Help needed moving from lying on your back to sitting on the side of a flat bed without using bedrails?: Total Help needed moving to and from a bed to a chair (including a wheelchair)?: Total Help needed standing up from a chair using your arms (e.g., wheelchair or bedside chair)?: Total Help needed to walk in hospital room?: Total Help needed climbing 3-5 steps with a railing? :  Total 6 Click Score: 6    End of Session Equipment Utilized During Treatment: Gait belt Activity Tolerance: Patient tolerated treatment well Patient left: in bed;with call bell/phone within reach;with bed alarm set;with SCD's reapplied Nurse Communication: Mobility status Pain - Right/Left: Left Pain - part of body: Hip     Time: 0102-7253 PT Time Calculation (min) (ACUTE ONLY): 8 min  Charges:  $Therapeutic Exercise: 8-22 mins                    Danielle Dess, PTA 03/28/20, 8:53 AM

## 2020-03-28 NOTE — Plan of Care (Signed)
Pt seems to be less agitated when pain medication is given. Poor PO intake, NS continued at 8ml/hr.  Problem: Education: Goal: Knowledge of General Education information will improve Description: Including pain rating scale, medication(s)/side effects and non-pharmacologic comfort measures Outcome: Progressing   Problem: Health Behavior/Discharge Planning: Goal: Ability to manage health-related needs will improve Outcome: Progressing   Problem: Clinical Measurements: Goal: Ability to maintain clinical measurements within normal limits will improve Outcome: Progressing Goal: Will remain free from infection Outcome: Progressing Goal: Diagnostic test results will improve Outcome: Progressing Goal: Respiratory complications will improve Outcome: Progressing Goal: Cardiovascular complication will be avoided Outcome: Progressing   Problem: Activity: Goal: Risk for activity intolerance will decrease Outcome: Progressing   Problem: Nutrition: Goal: Adequate nutrition will be maintained Outcome: Progressing   Problem: Coping: Goal: Level of anxiety will decrease Outcome: Progressing   Problem: Elimination: Goal: Will not experience complications related to bowel motility Outcome: Progressing Goal: Will not experience complications related to urinary retention Outcome: Progressing   Problem: Pain Managment: Goal: General experience of comfort will improve Outcome: Progressing   Problem: Safety: Goal: Ability to remain free from injury will improve Outcome: Progressing   Problem: Skin Integrity: Goal: Risk for impaired skin integrity will decrease Outcome: Progressing

## 2020-03-28 NOTE — Progress Notes (Signed)
PROGRESS NOTE    Kayla Fowler  QQV:[id number] DOB: April 01, 1927 DOA: 03/26/2020 PCP: Theodosia Blender, MD    Brief Narrative:  Patient admitted to the hospital with left displaced femoral neck fracture.  86 year old female past medical history for dementia who presents after a unwitnessed mechanical fall. Post trauma she had significant left hip pain. Limited history due to cognitive impairment. On her initial physical examination blood pressure 146/81, heart rate 91, respiratory rate 16, temperature 98.9, oxygen saturation 95%. Lungs clear to auscultation bilaterally, heart S1-S2, present rhythm, soft abdomen, no lower extremity edema, left leg was shortened. Left hip films with transcervical left femoral neck fracture with marked varus angulation.  Patient received analgesics and underwent left hip bipolar arthroplasty on 8/23.  Assessment & Plan:   Principal Problem:   Left displaced femoral neck fracture (HCC) Active Problems:   Dementia without behavioral disturbance (HCC)   Accidental fall   Preoperative clearance   Essential hypertension   Primary osteoarthritis involving multiple joints   Protein-calorie malnutrition, severe   1. Left femoral neck fracture. Sp left hip arthroplasty 08/23 Patient has been very confused and disorientated. Denies any pain at the surgical site.   Continue pain control (hydrocodone), dvt prophylaxis and physical/ occupational therapy evaluations.   2. Metabolic encephalopathy. Patient very weak and deconditioned, she is cofused and has been agitated per nursing report.  Will resume hydration with dextrose and balance electrolyte solutions. Consult speech therapy and nutrition.   On as needed haldol for agitation. Will discontinue lorazepam.   3. HTN. Her blood pressure has been stable, clinically hypovolemic.   4. Osteoarthritis. decreased mobility. Continue physical therapy evaluation,   5. Dementia/ depression. Continue with  duloxetine.   6. Iron deficiency anemia. Continue with iron supplementation.   Patient continue to be at high risk for worsening encephalopathy   Status is: Inpatient  Remains inpatient appropriate because:IV treatments appropriate due to intensity of illness or inability to take PO   Dispo: The patient is from: SNF              Anticipated d/c is to: SNF              Anticipated d/c date is: 2 days              Patient currently is medically stable to d/c.   DVT prophylaxis: Enoxaparin   Code Status:   full  Family Communication:  No family at the bedside      Nutrition Status: Nutrition Problem: Severe Malnutrition Etiology: social / environmental circumstances (dementia, advanced age) Signs/Symptoms: severe fat depletion, severe muscle depletion Interventions: Ensure Enlive (each supplement provides 350kcal and 20 grams of protein), Magic cup, MVI     Consultants:   Orthopedics.   Procedures:   Left hip arthroplasty   A  Subjective: Patient very weak and deconditioned, very poor oral intake, most information from nursing.   Objective: Vitals:   03/27/20 2312 03/28/20 0521 03/28/20 0830 03/28/20 1551  BP: (!) 99/50 118/69 (!) 100/47 (!) 110/41  Pulse: 98 (!) 109 85 87  Resp: 18 19 18 18   Temp: 98.5 F (36.9 C) 99.3 F (37.4 C) 97.8 F (36.6 C) 98.2 F (36.8 C)  TempSrc: Oral Oral Oral Oral  SpO2: 92% 93% 98% 97%  Weight:      Height:        Intake/Output Summary (Last 24 hours) at 03/28/2020 1618 Last data filed at 03/28/2020 1402 Gross per 24 hour  Intake 1431 ml  Output --  Net 1431 ml   Filed Weights   03/26/20 0311 03/26/20 1654  Weight: 63.5 kg 63.5 kg    Examination:   General: Not in pain or dyspnea, deconditioned  Neurology: Awake and alert, non focal  E ENT: positive pallor, no icterus, oral mucosa dry Cardiovascular: No JVD. S1-S2 present, rhythmic, no gallops, rubs, or murmurs. No lower extremity edema. Pulmonary: positive  breath sounds bilaterally,  Gastrointestinal. Abdomen soft and non tender Skin. No rashes Musculoskeletal: no joint deformities     Data Reviewed: I have personally reviewed following labs and imaging studies  CBC: Recent Labs  Lab 03/26/20 0316 03/27/20 0835 03/28/20 0614  WBC 10.6* 7.2 13.2*  HGB 12.2 10.8* 8.3*  HCT 37.1 33.2* 25.1*  MCV 96.6 97.1 97.7  PLT 265 215 186   Basic Metabolic Panel: Recent Labs  Lab 03/26/20 0316 03/27/20 0835 03/28/20 0614  NA 139 134* 136  K 3.9 3.4* 4.1  CL 101 97* 100  CO2 26 27 27   GLUCOSE 125* 112* 115*  BUN 23 17 23   CREATININE 0.81 0.72 0.92  CALCIUM 9.3 8.3* 8.0*   GFR: Estimated Creatinine Clearance: 37.3 mL/min (by C-G formula based on SCr of 0.92 mg/dL). Liver Function Tests: Recent Labs  Lab 03/26/20 0316  AST 31  ALT 16  ALKPHOS 62  BILITOT 1.0  PROT 7.3  ALBUMIN 4.0   No results for input(s): LIPASE, AMYLASE in the last 168 hours. No results for input(s): AMMONIA in the last 168 hours. Coagulation Profile: Recent Labs  Lab 03/26/20 0316  INR 0.9   Cardiac Enzymes: No results for input(s): CKTOTAL, CKMB, CKMBINDEX, TROPONINI in the last 168 hours. BNP (last 3 results) No results for input(s): PROBNP in the last 8760 hours. HbA1C: No results for input(s): HGBA1C in the last 72 hours. CBG: No results for input(s): GLUCAP in the last 168 hours. Lipid Profile: No results for input(s): CHOL, HDL, LDLCALC, TRIG, CHOLHDL, LDLDIRECT in the last 72 hours. Thyroid Function Tests: No results for input(s): TSH, T4TOTAL, FREET4, T3FREE, THYROIDAB in the last 72 hours. Anemia Panel: No results for input(s): VITAMINB12, FOLATE, FERRITIN, TIBC, IRON, RETICCTPCT in the last 72 hours.    Radiology Studies: I have reviewed all of the imaging during this hospital visit personally     Scheduled Meds: . acetaminophen  1,000 mg Oral Q8H  . chlorhexidine  1 application Topical Once  . Chlorhexidine Gluconate  Cloth  6 each Topical Q0600  . cholecalciferol  2,000 Units Oral Daily  . docusate sodium  100 mg Oral BID  . DULoxetine  40 mg Oral Daily  . enoxaparin (LOVENOX) injection  40 mg Subcutaneous Q24H  . ferrous sulfate  325 mg Oral Q breakfast  . melatonin  2.5 mg Oral QHS  . mirtazapine  7.5 mg Oral QHS  . multivitamin with minerals  1 tablet Oral Daily  . mupirocin ointment  1 application Nasal BID   Continuous Infusions: . sodium chloride 75 mL/hr at 03/27/20 1804  . methocarbamol (ROBAXIN) IV       LOS: 2 days        Brentt Fread 03/28/20, MD

## 2020-03-28 NOTE — Progress Notes (Signed)
Attempt to give morning medications, patient currently sleeping.

## 2020-03-28 NOTE — TOC Progression Note (Signed)
Transition of Care [facility]) - Progression Note    Patient Details  Name: Kayla Fowler MRN: [id number] Date of Birth: 09-21-1926  Transition of [facility]) CM/SW Contact  Barrie Dunker, RN Phone Number: 03/28/2020, 9:23 AM  Clinical Narrative:   No bed offers in [place] as of yet, sent request to [facility], awaiting bed offers         Expected Discharge Plan and Services                                                 Social Determinants of Health (SDOH) Interventions    Readmission Risk Interventions No flowsheet data found.

## 2020-03-28 NOTE — TOC Progression Note (Signed)
Transition of Care [facility]) - Progression Note    Patient Details  Name: Kayla Fowler MRN: [id number] Date of Birth: 1927/04/06  [facility]) CM/SW Contact  Barrie Dunker, RN Phone Number: 03/28/2020, 1:10 PM  Clinical Narrative:   Spoke with DSS worker and reviewed the bed offers, she accepted the Bed offer at [place] [facility], I called Shantel at [place] and accepted the bed offer, they will need PT notes to start the authorization for insurance. Will continue to monitor         Expected Discharge Plan and Services                                                 Social Determinants of Health (SDOH) Interventions    Readmission Risk Interventions No flowsheet data found.

## 2020-03-28 NOTE — TOC Progression Note (Signed)
Transition of Care [facility]) - Progression Note    Patient Details  Name: Kayla Fowler MRN: [id number] Date of Birth: 01-25-1927  [facility] of [facility]) CM/SW Contact  Barrie Dunker, RN Phone Number: 03/28/2020, 9:54 AM  Clinical Narrative:   Uploaded the Clinical notes to [place] [facility] for Pending PASSR         Expected Discharge Plan and Services                                                 Social Determinants of Health (SDOH) Interventions    Readmission Risk Interventions No flowsheet data found.

## 2020-03-29 LAB — CBC
HCT: 24.2 % — ABNORMAL LOW (ref 36.0–46.0)
Hemoglobin: 7.8 g/dL — ABNORMAL LOW (ref 12.0–15.0)
MCH: 31.7 pg (ref 26.0–34.0)
MCHC: 32.2 g/dL (ref 30.0–36.0)
MCV: 98.4 fL (ref 80.0–100.0)
Platelets: 216 10*3/uL (ref 150–400)
RBC: 2.46 MIL/uL — ABNORMAL LOW (ref 3.87–5.11)
RDW: 12.8 % (ref 11.5–15.5)
WBC: 11.7 10*3/uL — ABNORMAL HIGH (ref 4.0–10.5)
nRBC: 0 % (ref 0.0–0.2)

## 2020-03-29 LAB — SURGICAL PATHOLOGY

## 2020-03-29 MED ORDER — ENSURE ENLIVE PO LIQD
237.0000 mL | Freq: Two times a day (BID) | ORAL | Status: DC
  Administered 2020-03-29 (×2): 237 mL via ORAL

## 2020-03-29 MED ORDER — ACETAMINOPHEN 500 MG PO TABS
500.0000 mg | ORAL_TABLET | Freq: Three times a day (TID) | ORAL | Status: DC
  Administered 2020-03-30 – 2020-04-02 (×10): 500 mg via ORAL
  Filled 2020-03-29 (×10): qty 1

## 2020-03-29 NOTE — Plan of Care (Signed)

## 2020-03-29 NOTE — Care Management Important Message (Signed)
Important Message  Patient Details  Name: Kayla Fowler MRN: [id number] Date of Birth: 11/23/1926   Medicare Important Message Given:  Yes     [name] 03/29/2020, 12:39 PM

## 2020-03-29 NOTE — Evaluation (Signed)
Clinical/Bedside Swallow Evaluation Patient Details  Name: Kayla Fowler MRN: [id number] Date of Birth: Jun 23, 1927  Today's Date: 03/29/2020 Time: SLP Start Time (ACUTE ONLY): 1100 SLP Stop Time (ACUTE ONLY): 1154 SLP Time Calculation (min) (ACUTE ONLY): 54 min  Past Medical History:  Past Medical History:  Diagnosis Date  . Dementia (HCC) hypertension  . Hypertension    Past Surgical History:  Past Surgical History:  Procedure Laterality Date  . HIP ARTHROPLASTY Left 03/26/2020   Procedure: ARTHROPLASTY BIPOLAR HIP (HEMIARTHROPLASTY);  Surgeon: Deeann Saint, MD;  Location: [facility] ORS;  Service: Orthopedics;  Laterality: Left;   HPI:  Kayla Fowler admitting H and P: " Kayla Fowler is a 86 y.o. female with medical history significant for dementia who presents following an unwitnessed fall with complaints of pain to the left hip.  History limited due to dementia   Assessment / Plan / Recommendation Clinical Impression  Pt was alert and mostly pleasant for bedside swallow eval today. She followed directions but needed encouragement to take more than a few bites and sips. Nsg present for assessment and gave PO meds including pain meds crushed in applesauce as Pt was reporting pain and inability to participate further. Oral mech exam revealed only lower teeth and Pt reports no upper dentures. Pt tolerated several bites of applesauce and several sips of thin liquid without s/s of aspiration. Given crushed meds in applesauce, followed by sips of water, Pt did present with coughing after the last sip. She was able to chew a small piece of graham cracker but needed extended time to clear oral cavity. Rec Dys 2 diet, finely chopped with thin liquids as long as Pt is fully awake. Will monitor thin liquids closely and adjust to nectar thick if necessary. Prognosis Fair. St to f/u 1-2 days with toleration of diet. SLP Visit Diagnosis: Dysphagia, oropharyngeal phase (R13.12)    Aspiration Risk  Mild aspiration  risk;Moderate aspiration risk    Diet Recommendation Dysphagia 2 (Fine chop)   Liquid Administration via: Cup;Straw Medication Administration: Crushed with puree Supervision: Staff to assist with self feeding Compensations: Minimize environmental distractions;Slow rate;Small sips/bites Postural Changes: Seated upright at 90 degrees;Remain upright for at least 30 minutes after po intake    Other  Recommendations     Follow up Recommendations Skilled Nursing facility      Frequency and Duration min 2x/week  1 week       Prognosis Prognosis for Safe Diet Advancement: Fair Barriers to Reach Goals: Cognitive deficits      Swallow Study   General Date of Onset: 03/26/20 HPI: Kayla Fowler admitting H and P: " Kayla Fowler is a 86 y.o. female with medical history significant for dementia who presents following an unwitnessed fall with complaints of pain to the left hip.  History limited due to dementia Type of Study: Bedside Swallow Evaluation Diet Prior to this Study: Dysphagia 2 (chopped) Temperature Spikes Noted: No Respiratory Status: Room air History of Recent Intubation: No Behavior/Cognition: Alert;Cooperative Oral Care Completed by SLP: No Oral Cavity - Dentition: Poor condition;Missing dentition;Other (Comment) (No upper teeth, reports no dentures) Vision: Functional for self-feeding Self-Feeding Abilities: Needs assist;Needs set up Patient Positioning: Upright in bed Baseline Vocal Quality: Normal Volitional Cough: Strong    Oral/Motor/Sensory Function Overall Oral Motor/Sensory Function: Within functional limits   Ice Chips Ice chips: Within functional limits Presentation: Spoon   Thin Liquid Thin Liquid: Impaired Presentation: Cup;Spoon;Straw Pharyngeal  Phase Impairments: Other (comments) (Tolerated 15 sips but began coughing with last sip)  Nectar Thick Nectar Thick Liquid: Not tested   Honey Thick     Puree Puree: Within functional limits Presentation: Spoon    Solid     Solid: Impaired Presentation: Self Fed Oral Phase Impairments: Reduced lingual movement/coordination;Impaired mastication Oral Phase Functional Implications: Prolonged oral transit;Impaired mastication;Oral residue Pharyngeal Phase Impairments: Cough - Immediate      Eather Colas 03/29/2020,11:54 AM

## 2020-03-29 NOTE — Progress Notes (Signed)
PROGRESS NOTE    Kayla Fowler  SEG:[id number] DOB: 1926/09/26 DOA: 03/26/2020 PCP: Theodosia Blender, MD    Brief Narrative:  Patient admitted to the hospital with left displaced femoral neck fracture.  86 year old female past medical history for dementia who presents after a unwitnessed mechanical fall. Post trauma she had significant left hip pain. Limited history due to cognitive impairment. On her initial physical examination blood pressure 146/81, heart rate 91, respiratory rate 16, temperature 98.9, oxygen saturation 95%. Lungs clear to auscultation bilaterally, heart S1-S2, present rhythm, soft abdomen, no lower extremity edema, left leg was shortened. Left hip films with transcervical left femoral neck fracture with marked varus angulation.  Patient received analgesics and underwent left hip bipolar arthroplasty on 8/23.    Assessment & Plan:   Principal Problem:   Left displaced femoral neck fracture (HCC) Active Problems:   Dementia without behavioral disturbance (HCC)   Accidental fall   Preoperative clearance   Essential hypertension   Primary osteoarthritis involving multiple joints   Protein-calorie malnutrition, severe    1. Left femoral neck fracture. Sp left hip arthroplasty 08/23 .Continue to be confused and disorientated. Pain control with PRN hydrocodone and acetaminophen (scheduled q 8 H 500 mg), continue with dvt prophylaxis.  Patient will need SNF placement.  2. Metabolic encephalopathy. Continue to be confused and disorientated. She had one dose of haldol today at 15:00 hr, with good toleration.   Continue nutritional support. Will need SNF at discharge.  3. HTN. Continue to hold on antihypertensive medications.  4. Osteoarthritis. Poor mobility, will need SNF.   5. Dementia/ depression. On duloxetine and mirtazapine.   6. Iron deficiency anemia. On oral iron supplementation, her hgb and Hct are stable. Hgb 7,8 this am, will check  on iron panel in am.     Status is: Inpatient  Remains inpatient appropriate because:IV treatments appropriate due to intensity of illness or inability to take PO   Dispo: The patient is from: Home              Anticipated d/c is to: SNF              Anticipated d/c date is: 1 day              Patient currently is not medically stable to d/c.   DVT prophylaxis:  enoxaparin   Code Status:   full  Family Communication:  No family at the bedside      Nutrition Status: Nutrition Problem: Severe Malnutrition Etiology: social / environmental circumstances (dementia, advanced age) Signs/Symptoms: severe fat depletion, severe muscle depletion Interventions: Ensure Enlive (each supplement provides 350kcal and 20 grams of protein), Magic cup, MVI     Consultants:   orthopedics  Procedures:   Left hip arthroplasty     Subjective: Patient continue to be very weak and deconditioned, limited history due to cognitive impairment.   Objective: Vitals:   03/28/20 2359 03/29/20 0101 03/29/20 0734 03/29/20 1530  BP: (!) 127/55  (!) 135/52 107/88  Pulse: 94 85 (!) 101 (!) 102  Resp: 16  16 19   Temp: 98.6 F (37 C)  98.5 F (36.9 C) 98.6 F (37 C)  TempSrc: Oral  Oral Oral  SpO2: 97% 92% 94% 94%  Weight:      Height:        Intake/Output Summary (Last 24 hours) at 03/29/2020 1537 Last data filed at 03/29/2020 0400 Gross per 24 hour  Intake 725.7 ml  Output --  Net 725.7  ml   Filed Weights   03/26/20 0311 03/26/20 1654  Weight: 63.5 kg 63.5 kg    Examination:   General: Not in pain or dyspnea, deconditioned and ill looking appearing.  Neurology: Awake and alert, non focal  E ENT: positive pallor, no icterus, oral mucosa dry Cardiovascular: No JVD. S1-S2 present, rhythmic, no gallops, rubs, or murmurs. No lower extremity edema. Pulmonary: positive breath sounds bilaterally, adequate air movement, no wheezing, rhonchi or rales. Gastrointestinal. Abdomen soft and non  tender Skin. No rashes Musculoskeletal: no joint deformities     Data Reviewed: I have personally reviewed following labs and imaging studies  CBC: Recent Labs  Lab 03/26/20 0316 03/27/20 0835 03/28/20 0614 03/29/20 1035  WBC 10.6* 7.2 13.2* 11.7*  HGB 12.2 10.8* 8.3* 7.8*  HCT 37.1 33.2* 25.1* 24.2*  MCV 96.6 97.1 97.7 98.4  PLT 265 215 186 216   Basic Metabolic Panel: Recent Labs  Lab 03/26/20 0316 03/27/20 0835 03/28/20 0614  NA 139 134* 136  K 3.9 3.4* 4.1  CL 101 97* 100  CO2 26 27 27   GLUCOSE 125* 112* 115*  BUN 23 17 23   CREATININE 0.81 0.72 0.92  CALCIUM 9.3 8.3* 8.0*   GFR: Estimated Creatinine Clearance: 37.3 mL/min (by C-G formula based on SCr of 0.92 mg/dL). Liver Function Tests: Recent Labs  Lab 03/26/20 0316  AST 31  ALT 16  ALKPHOS 62  BILITOT 1.0  PROT 7.3  ALBUMIN 4.0   No results for input(s): LIPASE, AMYLASE in the last 168 hours. No results for input(s): AMMONIA in the last 168 hours. Coagulation Profile: Recent Labs  Lab 03/26/20 0316  INR 0.9   Cardiac Enzymes: No results for input(s): CKTOTAL, CKMB, CKMBINDEX, TROPONINI in the last 168 hours. BNP (last 3 results) No results for input(s): PROBNP in the last 8760 hours. HbA1C: No results for input(s): HGBA1C in the last 72 hours. CBG: No results for input(s): GLUCAP in the last 168 hours. Lipid Profile: No results for input(s): CHOL, HDL, LDLCALC, TRIG, CHOLHDL, LDLDIRECT in the last 72 hours. Thyroid Function Tests: No results for input(s): TSH, T4TOTAL, FREET4, T3FREE, THYROIDAB in the last 72 hours. Anemia Panel: No results for input(s): VITAMINB12, FOLATE, FERRITIN, TIBC, IRON, RETICCTPCT in the last 72 hours.    Radiology Studies: I have reviewed all of the imaging during this hospital visit personally     Scheduled Meds: . acetaminophen  1,000 mg Oral Q8H  . chlorhexidine  1 application Topical Once  . Chlorhexidine Gluconate Cloth  6 each Topical Q0600  .  cholecalciferol  2,000 Units Oral Daily  . docusate sodium  100 mg Oral BID  . DULoxetine  40 mg Oral Daily  . enoxaparin (LOVENOX) injection  40 mg Subcutaneous Q24H  . feeding supplement (ENSURE ENLIVE)  237 mL Oral BID BM  . ferrous sulfate  325 mg Oral Q breakfast  . melatonin  2.5 mg Oral QHS  . mirtazapine  7.5 mg Oral QHS  . multivitamin with minerals  1 tablet Oral Daily  . mupirocin ointment  1 application Nasal BID   Continuous Infusions: . dextrose 5% lactated ringers 75 mL/hr at 03/28/20 1819  . methocarbamol (ROBAXIN) IV       LOS: 3 days        Kayla Fowler 03/28/20, MD

## 2020-03-29 NOTE — Progress Notes (Signed)
Physical Therapy Treatment Patient Details Name: Kayla Fowler MRN: [id number] DOB: [birth year] Today's Date: 03/29/2020    History of Present Illness 86 y/o female suffered an unwitnessed fall with L hip fracture and subsequent hemiarthroplasty    PT Comments    Pt was supine in bed with HOB elevated ~ 20 degrees upon arriving. RN is in room, pt did just have SLP/swollowing eval and per therapist" She did better.". Pt has baseline dementia which impacts-slows progress throughout session. She is alert but requires increased time to process and repetition of desired task. Unable/unaware of wt bearing restrictions. She required max assist for all mobility and did attempt 1 transfer STS however unsafe and unable to adhere to [facility]. She returned to bed and was repositioned to [facility]. Pt had incontinent episode. Therapist assisted RN with hygiene/clean up. She was able to perform several exercises in bed with tactile cues and demonstration. At conclusion of session, pt was in bed with bed alarm in place and RN aware of pt's deficits. Pt will benefit form skilled rehab at [facility] to address deficits and assist her with returning to PLOF.      Follow Up Recommendations  SNF     Equipment Recommendations  Other (comment) (defer to next level of care)    Recommendations for Other Services       Precautions / Restrictions Precautions Precautions: Posterior Hip Precaution Booklet Issued: No Restrictions Weight Bearing Restrictions: Yes LLE Weight Bearing: Partial weight bearing    Mobility  Bed Mobility Overal bed mobility: Needs Assistance Bed Mobility: Supine to Sit;Sit to Supine     Supine to sit: Max assist Sit to supine: Max assist   General bed mobility comments: Max assist to progress from supine to sit and return after 1 trial of standing  Transfers Overall transfer level: Needs assistance Equipment used: Rolling walker (2 wheeled) Transfers: Sit to/from Stand Sit to Stand: Max  assist;From elevated surface         General transfer comment: Max assist + max vcs. unable to adhere to wt bearing precautions  Ambulation/Gait             General Gait Details: unable/unsafe   Stairs             Wheelchair Mobility    Modified Rankin (Stroke Patients Only)       Balance Overall balance assessment: Needs assistance Sitting-balance support: Bilateral upper extremity supported Sitting balance-Leahy Scale: Fair Sitting balance - Comments: Pt sat EOB x > 5 minutes     Standing balance-Leahy Scale: Poor Standing balance comment: max assist + max vcs to stand for less than 3 sec.                            Cognition Arousal/Alertness: Awake/alert Behavior During Therapy: Anxious Overall Cognitive Status: History of cognitive impairments - at baseline                                 General Comments: Pt is alert and talkative however pt ihas baseline dementia. Unsure if as severe as current presentation      Exercises Total Joint Exercises Ankle Circles/Pumps: AROM;Both Quad Sets: AROM;Both Hip ABduction/ADduction: AAROM Straight Leg Raises: AAROM    General Comments        Pertinent Vitals/Pain Pain Assessment: Faces Pain Score:  (pt endorses pain with movements) Faces Pain Scale: Hurts whole  lot Pain Location: L hip Pain Descriptors / Indicators: Sore;Grimacing Pain Intervention(s): Limited activity within patient's tolerance;Monitored during session;Premedicated before session;Repositioned;Ice applied    Home Living                      Prior Function            PT Goals (current goals can now be found in the care plan section) Acute Rehab PT Goals Patient Stated Goal: unable to confirm Progress towards PT goals: Progressing toward goals    Frequency    7X/week      PT Plan Current plan remains appropriate    Co-evaluation              AM-PAC PT "6 Clicks" Mobility    Outcome Measure  Help needed turning from your back to your side while in a flat bed without using bedrails?: A Lot Help needed moving from lying on your back to sitting on the side of a flat bed without using bedrails?: A Lot Help needed moving to and from a bed to a chair (including a wheelchair)?: A Lot Help needed standing up from a chair using your arms (e.g., wheelchair or bedside chair)?: A Lot Help needed to walk in hospital room?: Total Help needed climbing 3-5 steps with a railing? : Total 6 Click Score: 10    End of Session Equipment Utilized During Treatment: Gait belt Activity Tolerance: Patient tolerated treatment well Patient left: in bed;with call bell/phone within reach;with bed alarm set;with SCD's reapplied Nurse Communication: Mobility status PT Visit Diagnosis: Muscle weakness (generalized) (M62.81);Difficulty in walking, not elsewhere classified (R26.2);Pain Pain - Right/Left: Left Pain - part of body: Hip     Time: 1137-1201 PT Time Calculation (min) (ACUTE ONLY): 24 min  Charges:  $Therapeutic Exercise: 8-22 mins $Therapeutic Activity: 8-22 mins                     Jetta Lout PTA 03/29/20, 1:26 PM

## 2020-03-30 LAB — IRON AND TIBC
Iron: 12 ug/dL — ABNORMAL LOW (ref 28–170)
Saturation Ratios: 7 % — ABNORMAL LOW (ref 10.4–31.8)
TIBC: 179 ug/dL — ABNORMAL LOW (ref 250–450)
UIBC: 167 ug/dL

## 2020-03-30 LAB — CBC WITH DIFFERENTIAL/PLATELET
Abs Immature Granulocytes: 0.06 10*3/uL (ref 0.00–0.07)
Basophils Absolute: 0 10*3/uL (ref 0.0–0.1)
Basophils Relative: 0 %
Eosinophils Absolute: 0 10*3/uL (ref 0.0–0.5)
Eosinophils Relative: 0 %
HCT: 21.3 % — ABNORMAL LOW (ref 36.0–46.0)
Hemoglobin: 7 g/dL — ABNORMAL LOW (ref 12.0–15.0)
Immature Granulocytes: 1 %
Lymphocytes Relative: 11 %
Lymphs Abs: 1.2 10*3/uL (ref 0.7–4.0)
MCH: 31.7 pg (ref 26.0–34.0)
MCHC: 32.9 g/dL (ref 30.0–36.0)
MCV: 96.4 fL (ref 80.0–100.0)
Monocytes Absolute: 1 10*3/uL (ref 0.1–1.0)
Monocytes Relative: 10 %
Neutro Abs: 8.2 10*3/uL — ABNORMAL HIGH (ref 1.7–7.7)
Neutrophils Relative %: 78 %
Platelets: 257 10*3/uL (ref 150–400)
RBC: 2.21 MIL/uL — ABNORMAL LOW (ref 3.87–5.11)
RDW: 12.6 % (ref 11.5–15.5)
WBC: 10.4 10*3/uL (ref 4.0–10.5)
nRBC: 0 % (ref 0.0–0.2)

## 2020-03-30 LAB — BASIC METABOLIC PANEL
Anion gap: 7 (ref 5–15)
BUN: 12 mg/dL (ref 8–23)
CO2: 31 mmol/L (ref 22–32)
Calcium: 8.7 mg/dL — ABNORMAL LOW (ref 8.9–10.3)
Chloride: 101 mmol/L (ref 98–111)
Creatinine, Ser: 0.61 mg/dL (ref 0.44–1.00)
GFR calc Af Amer: >60 mL/min (ref >60)
GFR calc non Af Amer: >60 mL/min (ref >60)
Glucose, Bld: 151 mg/dL — ABNORMAL HIGH (ref 70–99)
Potassium: 3.8 mmol/L (ref 3.5–5.1)
Sodium: 139 mmol/L (ref 135–145)

## 2020-03-30 LAB — TRANSFERRIN: Transferrin: 133 mg/dL — ABNORMAL LOW (ref 192–382)

## 2020-03-30 LAB — FERRITIN: Ferritin: 156 ng/mL (ref 11–307)

## 2020-03-30 LAB — PREPARE RBC (CROSSMATCH)

## 2020-03-30 MED ORDER — NEPRO/CARBSTEADY PO LIQD
237.0000 mL | Freq: Two times a day (BID) | ORAL | Status: DC
  Administered 2020-03-30: 237 mL via ORAL

## 2020-03-30 MED ORDER — SODIUM CHLORIDE 0.9% IV SOLUTION: Freq: Once | INTRAVENOUS | Status: AC

## 2020-03-30 NOTE — Progress Notes (Signed)
Nutrition Follow-up  DOCUMENTATION CODES:   Severe malnutrition in context of social or environmental circumstances  INTERVENTION:  Discontinue Ensure   Nepro Shake po BID, each supplement provides 425 kcal and 19 grams protein  Continue MVI with minerals  No BMs since admission, consider daily bowel regimen  NUTRITION DIAGNOSIS:   Severe Malnutrition related to social / environmental circumstances (dementia, advanced age) as evidenced by severe fat depletion, severe muscle depletion. -ongoing   GOAL:   Patient will meet greater than or equal to 90% of their needs -progressing   MONITOR:   Diet advancement, PO intake, Supplement acceptance, Labs, Weight trends, Skin, I & O's  REASON FOR ASSESSMENT:   Consult Hip fracture protocol  ASSESSMENT:   86 year old female with PMHx of dementia, HTN admitted after fall with left displaced femoral neck fracture.  Patient is s/p left hip arthroplasty on 8/23. Patient is very weak and deconditioned with acute on chronic anemia post-operative, received PRBC transfusion and iron, plans for discharge to SNF in am. Patient seen by SLP today, fluid consistency changed to nectar thick, Ensure supplement discontinued and ordered Nepro. Patient with 0% po intake of lunch and dinner meals on 8/24 and 0% of dinner on 8/25. No documented bowel movements this admission could be contributing to poor po intake, pt receiving dialy Colace, consider giving the PRN Senokot.   No new weights this admission, per chart weights have trended up 18 lbs in the last 5 months.  Medications reviewed and include: D3, Colace, Ferrous sulfate, Melatonin, Remeron, MVI Labs: Hgb 7 (L), HCT 21.3 (L)  Diet Order:   Diet Order            DIET - DYS 1 Room service appropriate? Yes with Assist; Fluid consistency: Nectar Thick  Diet effective now                 EDUCATION NEEDS:   No education needs have been identified at this time  Skin:  Skin Assessment:  Reviewed RN Assessment  Last BM:  Unknown  Height:   Ht Readings from Last 1 Encounters:  03/26/20 5\' 6"  (1.676 m)    Weight:   Wt Readings from Last 1 Encounters:  03/26/20 63.5 kg    BMI:  Body mass index is 22.6 kg/m.  Estimated Nutritional Needs:   Kcal:  1600-1800  Protein:  80-90 grams  Fluid:  1.6 L/day   03/28/20, RD, LDN Clinical Nutrition After Hours/Weekend Pager # in Amion

## 2020-03-30 NOTE — Progress Notes (Signed)
Subjective: 4 Days Post-Op Procedure(s) (LRB): ARTHROPLASTY BIPOLAR HIP (HEMIARTHROPLASTY) (Left)   Patient is more alert today.  I am able to move the hip with much less complaints of pain.  The hip is stable.  Dressing is dry.  Awaiting skilled nursing transfer.  Patient reports pain as mild.  Objective:   VITALS:   Vitals:   03/30/20 0135 03/30/20 0848  BP:  128/68  Pulse: 91 91  Resp:  16  Temp:  98 F (36.7 C)  SpO2: 94% 100%    Neurologically intact ABD soft Sensation intact distally Intact pulses distally Dorsiflexion/Plantar flexion intact Incision: dressing C/D/I  LABS Recent Labs    03/28/20 0614 03/29/20 1035 03/30/20 0637  HGB 8.3* 7.8* 7.0*  HCT 25.1* 24.2* 21.3*  WBC 13.2* 11.7* 10.4  PLT 186 216 257    Recent Labs    03/28/20 0614 03/30/20 0637  NA 136 139  K 4.1 3.8  BUN 23 12  CREATININE 0.92 0.61  GLUCOSE 115* 151*    No results for input(s): LABPT, INR in the last 72 hours.   Assessment/Plan: 4 Days Post-Op Procedure(s) (LRB): ARTHROPLASTY BIPOLAR HIP (HEMIARTHROPLASTY) (Left)   Advance diet Up with therapy Discharge to SNF

## 2020-03-30 NOTE — Progress Notes (Signed)
PROGRESS NOTE    Kayla Fowler  EPP:[id number] DOB: [birth year] DOA: 03/26/2020 PCP: Theodosia Blender, MD    Brief Narrative:  Patient admitted to the hospital with left displaced femoral neck fracture.  86 year old female past medical history for dementia who presents after a unwitnessed mechanical fall. Post trauma she had significant left hip pain. Limited history due to cognitive impairment. On her initial physical examination blood pressure 146/81, heart rate 91, respiratory rate16, temperature 98.9, oxygen saturation 95%.Lungs clear to auscultation bilaterally, heart S1-S2, present rhythm, soft abdomen, no lower extremity edema, left leg was shortened. Left hip films with transcervical left femoral neck fracture with marked varus angulation.  Patient received analgesics and underwent left hip bipolar arthroplasty on 8/23.  Patient very weak and deconditioned, noted to have acute on chronic anemia post-operative. Plan for PRBC transfusion and Iron infusion in am.  Plan for discharge to SNF in am.    Assessment & Plan:   Principal Problem:   Left displaced femoral neck fracture (HCC) Active Problems:   Dementia without behavioral disturbance (HCC)   Accidental fall   Preoperative clearance   Essential hypertension   Primary osteoarthritis involving multiple joints   Protein-calorie malnutrition, severe   1. Left femoral neck fracture. Sp left hip arthroplasty 08/23 . Continue pain control with  hydrocodone and acetaminophen.  Continue with dvt prophylaxis.   Will need to continue care at [facility] for physical therapy.   2. Metabolic encephalopathy.  Episodic agitation, required one doe of haldol yesterday PM.  To my examination she is calm and cooperative, her confusion is likely at her baseline consistent with her dementia and chronic cognitive impairment.   Speech therapy recommended dysphagia 1 with aspiration precautions. Continue nutritional supplementation.  Will discontinue IV fluids now.   3. HTN. her blood pressure is 128 to 129 mmHg systolic. Continue close monitoring, continue to hold on antihypertensive medications.   4. Osteoarthritis. Plan for SNF.    5. Dementia/ depression. Continue with duloxetine and mirtazapine.   6. Iron deficiency anemia. Hgb is down to 7,0 today, will plan to transfuse one unit PRBC. Her serum iron is 12,  tibc is 179, transferrin saturation is 7 and ferritin I 156 with transferrin 133.   Will order IV iron infusion before discharge. Continue with nutrition supplementation.  Consult nutrition.    Status is: Inpatient  Remains inpatient appropriate because:IV treatments appropriate due to intensity of illness or inability to take PO   Dispo: The patient is from: Home              Anticipated d/c is to: SNF              Anticipated d/c date is: 1 day              Patient currently is not medically stable to d/c.   DVT prophylaxis: Enoxaparin   Code Status:   full  Family Communication:  No family the bedside     Nutrition Status: Nutrition Problem: Severe Malnutrition Etiology: social / environmental circumstances (dementia, advanced age) Signs/Symptoms: severe fat depletion, severe muscle depletion Interventions: Ensure Enlive (each supplement provides 350kcal and 20 grams of protein), Magic cup, MVI    Consultants:   orthopedics  Procedures:   Left hip arthroplasty      Subjective: Patient continue to be very weak and deconditioned, no agitation at the time of my examination, no nausea or vomiting.   Objective: Vitals:   03/29/20 2026 03/29/20 2317 03/30/20 0135 03/30/20  0848  BP: (!) 119/55 129/88  128/68  Pulse: (!) 107 80 91 91  Resp: 16 17  16   Temp: 97.9 F (36.6 C) 98.9 F (37.2 C)  98 F (36.7 C)  TempSrc: Oral Oral  Oral  SpO2: (!) 89% (!) 88% 94% 100%  Weight:      Height:        Intake/Output Summary (Last 24 hours) at 03/30/2020 1256 Last data filed at  03/30/2020 04/01/2020 Gross per 24 hour  Intake 1579.39 ml  Output --  Net 1579.39 ml   Filed Weights   03/26/20 0311 03/26/20 1654  Weight: 63.5 kg 63.5 kg    Examination:   General: Not in pain or dyspnea, deconditioned and ill looking appearing Neurology: Awake and alert, confused and disorientated, not agitated. E ENT: positive pallor, no icterus, oral mucosa moist Cardiovascular: No JVD. S1-S2 present, rhythmic, no gallops, rubs, or murmurs. No lower extremity edema. Pulmonary: positive breath sounds bilaterally, adequate air movement, no wheezing, rhonchi or rales. Gastrointestinal. Abdomen soft and non tender Skin. No rashes Musculoskeletal: no joint deformities     Data Reviewed: I have personally reviewed following labs and imaging studies  CBC: Recent Labs  Lab 03/26/20 0316 03/27/20 0835 03/28/20 0614 03/29/20 1035 03/30/20 0637  WBC 10.6* 7.2 13.2* 11.7* 10.4  NEUTROABS  --   --   --   --  8.2*  HGB 12.2 10.8* 8.3* 7.8* 7.0*  HCT 37.1 33.2* 25.1* 24.2* 21.3*  MCV 96.6 97.1 97.7 98.4 96.4  PLT 265 215 186 216 257   Basic Metabolic Panel: Recent Labs  Lab 03/26/20 0316 03/27/20 0835 03/28/20 0614 03/30/20 0637  NA 139 134* 136 139  K 3.9 3.4* 4.1 3.8  CL 101 97* 100 101  CO2 26 27 27 31   GLUCOSE 125* 112* 115* 151*  BUN 23 17 23 12   CREATININE 0.81 0.72 0.92 0.61  CALCIUM 9.3 8.3* 8.0* 8.7*   GFR: Estimated Creatinine Clearance: 42.9 mL/min (by C-G formula based on SCr of 0.61 mg/dL). Liver Function Tests: Recent Labs  Lab 03/26/20 0316  AST 31  ALT 16  ALKPHOS 62  BILITOT 1.0  PROT 7.3  ALBUMIN 4.0   No results for input(s): LIPASE, AMYLASE in the last 168 hours. No results for input(s): AMMONIA in the last 168 hours. Coagulation Profile: Recent Labs  Lab 03/26/20 0316  INR 0.9   Cardiac Enzymes: No results for input(s): CKTOTAL, CKMB, CKMBINDEX, TROPONINI in the last 168 hours. BNP (last 3 results) No results for input(s): PROBNP  in the last 8760 hours. HbA1C: No results for input(s): HGBA1C in the last 72 hours. CBG: No results for input(s): GLUCAP in the last 168 hours. Lipid Profile: No results for input(s): CHOL, HDL, LDLCALC, TRIG, CHOLHDL, LDLDIRECT in the last 72 hours. Thyroid Function Tests: No results for input(s): TSH, T4TOTAL, FREET4, T3FREE, THYROIDAB in the last 72 hours. Anemia Panel: Recent Labs    03/30/20 0637  FERRITIN 156  TIBC 179*  IRON 12*      Radiology Studies: I have reviewed all of the imaging during this hospital visit personally     Scheduled Meds: . sodium chloride   Intravenous Once  . acetaminophen  500 mg Oral Q8H  . chlorhexidine  1 application Topical Once  . Chlorhexidine Gluconate Cloth  6 each Topical Q0600  . cholecalciferol  2,000 Units Oral Daily  . docusate sodium  100 mg Oral BID  . DULoxetine  40 mg Oral Daily  .  enoxaparin (LOVENOX) injection  40 mg Subcutaneous Q24H  . feeding supplement (NEPRO CARB STEADY)  237 mL Oral BID BM  . ferrous sulfate  325 mg Oral Q breakfast  . melatonin  2.5 mg Oral QHS  . mirtazapine  7.5 mg Oral QHS  . multivitamin with minerals  1 tablet Oral Daily  . mupirocin ointment  1 application Nasal BID   Continuous Infusions: . dextrose 5% lactated ringers 75 mL/hr at 03/30/20 0224     LOS: 4 days        Saba Neuman Annett Gula, MD

## 2020-03-30 NOTE — Progress Notes (Signed)
Speech Language Pathology Treatment: Dysphagia  Patient Details Name: Kayla Fowler MRN: [id number] DOB: [birth year] Today's Date: 03/30/2020 Time: 6004-5997 SLP Time Calculation (min) (ACUTE ONLY): 45 min  Assessment / Plan / Recommendation Clinical Impression  Pt continues to present w/concern for oropharyngeal phase swallowfunction and this morning seems min more declined Cognitively per NSG/PT report. Pt has Mitts bilaterally and required Mod. Verbal cues for follow through. Suspect decreasedawareness during oral intake tasks/po intaked/t declined Cognitive status, baseline Dementia. TheCognitive declineimpacts heroverall awareness/engagement w/ po tasks; thiscanincrease risk forchoking/aspiration.Though present d/therCognitive decline, therisk foraspiration is reduced w/ a Modified Diet and when following general aspiration precautions and when directed and supported during the meal w/ setup and guidance w/ self-feeding.She requires min-mod verbal/tactile/visualcues for orientation to/engagement w/food and drink items present;andanoverall need to reduce distractions during pointake is necessary.  Ptwas positioned upright in bed then supported in self-feeding (holding Cup) and eating of trials of thin liquids via cup then straw,purees, and minced/soft solids w/ her Breakfast meal. Overt clinical s/s of aspiration noted w/ trials of thin liquids c/b immediate/delayed coughing; No overt clinical s/s of aspiration noted w/ trials of Nectar liquids and foods - no decline in phonations, no cough, and no decline in respiratory status during/post trials. Oral phase was grossly [facility] for bolus management and oral clearing of the liquid and purees boluses given; however, much increased oral phase time and diffuse oral residue was noted w/ trials of minced foods. Attempted alternating foods/liquids w/ some success. Pt seemed unaware of her oral residue often talking w/ food in remaining in  mouth. Verbal cues given to use lingual sweeping to clear as well -- unsure of carry-over of new information d/t Baseline Dementia. Min-Mod cues and less talking/engaging w/ pt to allow her to focus on feeding tasks.Pt stated she was "full" fairly quickly after bites/sips. Pt seemed weak and w/ less focus/attention on po tasks overall which can increase risk for aspiration.  Discussed diet consistency of foods/Necarliquids w/ pt/NSG; aspiration precautions; pills Crushed in puree;self-feeding support and supervisionat meals w/ NSG. Handoutsposted in room, chart. Recommend a Dysphagia level1(puree w/ gravies) w/Nectarliquids via cup/straw; general aspiration precautions; reduce Distractions during meals. Pills Crushed in Puree for safer swallowing. Support atallmealsd/t Cognitive decline/Dementia. Offer drink supplements to support nutrition also. Recommend Dietician f/u for support. NSG/MDupdated.      HPI HPI: Pt is a 86 y.o. female with medical history significant for Dementia who presents following an unwitnessed fall with complaints of pain to the left hip.  History limited due to dementia.  Pt had a L hip fracture and subsequent hemiarthroplasty - 4 days post-op.      SLP Plan  Continue with current plan of care       Recommendations  Diet recommendations: Dysphagia 1 (puree);Nectar-thick liquid Liquids provided via: Cup;Straw Medication Administration: Crushed with puree (for ease, safety of swallowing) Supervision: Patient able to self feed;Full supervision/cueing for compensatory strategies;Staff to assist with self feeding (hold cup) Compensations: Minimize environmental distractions;Slow rate;Small sips/bites;Lingual sweep for clearance of pocketing;Follow solids with liquid Postural Changes and/or Swallow Maneuvers: Seated upright 90 degrees;Upright 30-60 min after meal                General recommendations: Other(comment) (Dietician f/u; Palliative Care consult  for GOC) Oral Care Recommendations: Oral care BID;Staff/trained caregiver to provide oral care;Oral care before and after PO Follow up Recommendations: Skilled Nursing facility SLP Visit Diagnosis: Dysphagia, oropharyngeal phase (R13.12) (baseline Dementia) Plan: Continue with current plan of care  GO                 Jerilynn Som, MS, CCC-SLP Kayla Fowler 03/30/2020, 12:36 PM

## 2020-03-30 NOTE — Plan of Care (Signed)
°  Problem: Education: Goal: Knowledge of General Education information will improve Description: Including pain rating scale, medication(s)/side effects and non-pharmacologic comfort measures Outcome: Progressing   Problem: Health Behavior/Discharge Planning: Goal: Ability to manage health-related needs will improve Outcome: Progressing   Problem: Clinical Measurements: Goal: Ability to maintain clinical measurements within normal limits will improve Outcome: Progressing Goal: Will remain free from infection Outcome: Progressing Goal: Diagnostic test results will improve Outcome: Progressing Goal: Respiratory complications will improve Outcome: Progressing Goal: Cardiovascular complication will be avoided Outcome: Progressing   Problem: Activity: Goal: Risk for activity intolerance will decrease Outcome: Progressing   Problem: Nutrition: Goal: Adequate nutrition will be maintained Outcome: Progressing   Problem: Coping: Goal: Level of anxiety will decrease Outcome: Progressing   Problem: Elimination: Goal: Will not experience complications related to bowel motility Outcome: Progressing Goal: Will not experience complications related to urinary retention Outcome: Progressing   Problem: Pain Managment: Goal: General experience of comfort will improve Outcome: Progressing   Problem: Safety: Goal: Ability to remain free from injury will improve Outcome: Progressing   Problem: Skin Integrity: Goal: Risk for impaired skin integrity will decrease Outcome: Progressing  Pt unable to acknowledge learning - d/t dementia - she remains confused - consent obtained from Pt guardian for blood transfusion and witnessed by Joni Reining .

## 2020-03-30 NOTE — Progress Notes (Signed)
PT Cancellation Note  Patient Details Name: Kayla Fowler MRN: [id number] DOB: April 04, 1927   Cancelled Treatment:     PT hold. Pt's HgB is 7.0 and ordered in chart for blood transfusion. Will hold PT this date and return tomorrow to continue  POC.   Rushie Chestnut 03/30/2020, 1:10 PM

## 2020-03-31 ENCOUNTER — Inpatient Hospital Stay: Payer: Medicare HMO

## 2020-03-31 DIAGNOSIS — S7012XA Contusion of left thigh, initial encounter: Secondary | ICD-10-CM

## 2020-03-31 LAB — TYPE AND SCREEN
ABO/RH(D): A POS
Antibody Screen: NEGATIVE
Unit division: 0

## 2020-03-31 LAB — BPAM RBC
Blood Product Expiration Date: 202109302359
ISSUE DATE / TIME: 202108271601
Unit Type and Rh: 6200

## 2020-03-31 LAB — HEMOGLOBIN AND HEMATOCRIT, BLOOD
HCT: 23.9 % — ABNORMAL LOW (ref 36.0–46.0)
Hemoglobin: 8.1 g/dL — ABNORMAL LOW (ref 12.0–15.0)

## 2020-03-31 MED ORDER — SODIUM CHLORIDE 0.9 % IV SOLN
510.0000 mg | Freq: Once | INTRAVENOUS | Status: AC
  Administered 2020-03-31: 510 mg via INTRAVENOUS
  Filled 2020-03-31: qty 17

## 2020-03-31 MED ORDER — QUETIAPINE FUMARATE 25 MG PO TABS
25.0000 mg | ORAL_TABLET | Freq: Two times a day (BID) | ORAL | Status: DC
  Administered 2020-03-31 – 2020-04-02 (×4): 25 mg via ORAL
  Filled 2020-03-31 (×4): qty 1

## 2020-03-31 MED ORDER — NYSTATIN 100000 UNIT/ML MT SUSP
5.0000 mL | Freq: Four times a day (QID) | OROMUCOSAL | Status: DC
  Administered 2020-03-31 – 2020-04-02 (×6): 500000 [IU] via OROMUCOSAL
  Filled 2020-03-31 (×7): qty 5

## 2020-03-31 NOTE — TOC Progression Note (Signed)
Transition of Care [facility]) - Progression Note    Patient Details  Name: Kayla Fowler MRN: [id number] Date of Birth: May 13, 1927  Transition of [facility] CM/SW Contact  [name], [name], [name] Phone Number: 03/31/2020, 5:22 PM  Clinical Narrative:    Pt. Is unstable no discharge today. To receive PRBC's for anemia.  TOC team to continue to follow.        Expected Discharge Plan and Services                                                 Social Determinants of Health (SDOH) Interventions    Readmission Risk Interventions No flowsheet data found.

## 2020-03-31 NOTE — Progress Notes (Addendum)
PROGRESS NOTE    Kayla Fowler  IHK:[id number] DOB: Jan 14, 1927 DOA: 03/26/2020 PCP: Theodosia Blender, MD    Brief Narrative:  Patient admitted to the hospital with left displaced femoral neck fracture, sp arthroplasty. Now complicated with left hip hematoma with acute blood loss anemia.  86 year old female past medical history for dementia who presents after a unwitnessed mechanical fall. Post trauma she had significant left hip pain. Limited history due to cognitive impairment. On her initial physical examination blood pressure 146/81, heart rate 91, respiratory rate16, temperature 98.9, oxygen saturation 95%.Lungs clear to auscultation bilaterally, heart S1-S2, present rhythm, soft abdomen, no lower extremity edema, left leg was shortened. Left hip films with transcervical left femoral neck fracture with marked varus angulation.  Patient received analgesics and underwent left hip bipolar arthroplasty on 8/23.  Patient very weak and deconditioned, noted to have acute on chronic anemia post-operative. Plan for PRBC transfusion and Iron infusion in am.  Plan for discharge to SNF in am.   Patient has developed a larger left thigh hematoma with acute blood loss anemia, required one unit PRBC transfusion.    Assessment & Plan:   Principal Problem:   Left displaced femoral neck fracture (HCC) Active Problems:   Dementia without behavioral disturbance (HCC)   Accidental fall   Preoperative clearance   Essential hypertension   Primary osteoarthritis involving multiple joints   Protein-calorie malnutrition, severe   Hematoma of left thigh   1. Left femoral neck fracture. Sp left hip arthroplasty 08/23.NEW complicated with large left thigh hematoma.  Continue pain controlwith acetaminophen. Last time patient had hydrocodone was 08/26. She has developed a large left thigh hematoma with acute blood loss anemia, Hgb down to 7.0, required one unit PRBC transfusion Hgb today up  to 8.1 with Hct at 23.9.   Will order CT non contrast left thigh to assess extension of hematoma. Continue close monitoring of Hgb and hct, hold on pharmacological DVT prophylaxis for now.  Hold on transfer to SNF for now.   2. Metabolic encephalopathy.Patient is more calm today, no significant agitation, last dose of haloperidol was on 08/26.  Continue with nutritional support, dysphagia 1 with aspiration precautions.Continue to hold on IV fluids for now.  3. HTN.Stable blood pressure, continue to hold on antihypertensive medications.    4. Osteoarthritis.Continue PT and OT .   5. Dementia/ depression.Onduloxetineand mirtazapine.  6. Iron deficiency anemia. serum iron is 12,  tibc is 179, transferrin saturation is 7 and ferritin I 156 with transferrin 133.   Will discontinue oral iron and will add one dose of VI iron, follow iron panel as outpatient.   7. Severe malnutrition. Continue with nutritional supplementation.   Patient continue to be at high risk for worsening anemia.   Status is: Inpatient  Remains inpatient appropriate because:Inpatient level of care appropriate due to severity of illness   Dispo: The patient is from: Home              Anticipated d/c is to: SNF              Anticipated d/c date is: 2 days              Patient currently is not medically stable to d/c.   DVT prophylaxis: scd   Code Status:   full  Family Communication:  No family at the bedside      Nutrition Status: Nutrition Problem: Severe Malnutrition Etiology: social / environmental circumstances (dementia, advanced age) Signs/Symptoms: severe fat depletion, severe  muscle depletion Interventions: Ensure Enlive (each supplement provides 350kcal and 20 grams of protein), Magic cup, MVI     Consultants:  orthopedics  Procedures:  Left hip arthroplasty  Subjective: Patient is calm, continue to have confusion. Denies any pain or dyspnea, no nausea or vomiting,  no abdominal pain or hip pain. She has been noted to have left hip pain when moved. Most information from nursing at the bedside.   Objective: Vitals:   03/30/20 1833 03/30/20 1858 03/30/20 2323 03/31/20 0810  BP: (!) 112/49 130/72 104/77 (!) 136/56  Pulse: 82 86 90 76  Resp: 18  17 20   Temp: 98.6 F (37 C) 98.1 F (36.7 C) 98.6 F (37 C) 98.6 F (37 C)  TempSrc: Oral Oral Oral Oral  SpO2: 96% 92% 96% 90%  Weight:      Height:        Intake/Output Summary (Last 24 hours) at 03/31/2020 0835 Last data filed at 03/30/2020 1859 Gross per 24 hour  Intake 360.42 ml  Output --  Net 360.42 ml   Filed Weights   03/26/20 0311 03/26/20 1654  Weight: 63.5 kg 63.5 kg    Examination:   General: deconditioned.  Neurology: Awake and alert, non focal, deconditioned.   E ENT: no pallor, no icterus, oral mucosa moist Cardiovascular: No JVD. S1-S2 present, rhythmic, no gallops, rubs, or murmurs. Trace lower extremity edema. Pulmonary: vesicular breath sounds bilaterally, adequate air movement, no wheezing, rhonchi or rales. Gastrointestinal. Abdomen soft and non tender Skin. Larger hematoma left thigh, non tender.  Musculoskeletal: left hip deformity due to large hematoma.        Data Reviewed: I have personally reviewed following labs and imaging studies  CBC: Recent Labs  Lab 03/26/20 0316 03/26/20 0316 03/27/20 0835 03/28/20 0614 03/29/20 1035 03/30/20 0637 03/31/20 0556  WBC 10.6*  --  7.2 13.2* 11.7* 10.4  --   NEUTROABS  --   --   --   --   --  8.2*  --   HGB 12.2   < > 10.8* 8.3* 7.8* 7.0* 8.1*  HCT 37.1   < > 33.2* 25.1* 24.2* 21.3* 23.9*  MCV 96.6  --  97.1 97.7 98.4 96.4  --   PLT 265  --  215 186 216 257  --    < > = values in this interval not displayed.   Basic Metabolic Panel: Recent Labs  Lab 03/26/20 0316 03/27/20 0835 03/28/20 0614 03/30/20 0637  NA 139 134* 136 139  K 3.9 3.4* 4.1 3.8  CL 101 97* 100 101  CO2 26 27 27 31   GLUCOSE 125* 112*  115* 151*  BUN 23 17 23 12   CREATININE 0.81 0.72 0.92 0.61  CALCIUM 9.3 8.3* 8.0* 8.7*   GFR: Estimated Creatinine Clearance: 42.9 mL/min (by C-G formula based on SCr of 0.61 mg/dL). Liver Function Tests: Recent Labs  Lab 03/26/20 0316  AST 31  ALT 16  ALKPHOS 62  BILITOT 1.0  PROT 7.3  ALBUMIN 4.0   No results for input(s): LIPASE, AMYLASE in the last 168 hours. No results for input(s): AMMONIA in the last 168 hours. Coagulation Profile: Recent Labs  Lab 03/26/20 0316  INR 0.9   Cardiac Enzymes: No results for input(s): CKTOTAL, CKMB, CKMBINDEX, TROPONINI in the last 168 hours. BNP (last 3 results) No results for input(s): PROBNP in the last 8760 hours. HbA1C: No results for input(s): HGBA1C in the last 72 hours. CBG: No results for input(s): GLUCAP in  the last 168 hours. Lipid Profile: No results for input(s): CHOL, HDL, LDLCALC, TRIG, CHOLHDL, LDLDIRECT in the last 72 hours. Thyroid Function Tests: No results for input(s): TSH, T4TOTAL, FREET4, T3FREE, THYROIDAB in the last 72 hours. Anemia Panel: Recent Labs    03/30/20 0637  FERRITIN 156  TIBC 179*  IRON 12*      Radiology Studies: I have reviewed all of the imaging during this hospital visit personally     Scheduled Meds: . acetaminophen  500 mg Oral Q8H  . chlorhexidine  1 application Topical Once  . cholecalciferol  2,000 Units Oral Daily  . docusate sodium  100 mg Oral BID  . DULoxetine  40 mg Oral Daily  . enoxaparin (LOVENOX) injection  40 mg Subcutaneous Q24H  . feeding supplement (NEPRO CARB STEADY)  237 mL Oral BID BM  . melatonin  2.5 mg Oral QHS  . mirtazapine  7.5 mg Oral QHS  . multivitamin with minerals  1 tablet Oral Daily   Continuous Infusions: . ferumoxytol       LOS: 5 days        Marieelena Bartko Annett Gula, MD

## 2020-03-31 NOTE — Plan of Care (Signed)
  Problem: Education: Goal: Knowledge of General Education information will improve Description: Including pain rating scale, medication(s)/side effects and non-pharmacologic comfort measures Outcome: Progressing   Problem: Health Behavior/Discharge Planning: Goal: Ability to manage health-related needs will improve Outcome: Progressing   Problem: Clinical Measurements: Goal: Ability to maintain clinical measurements within normal limits will improve Outcome: Progressing Goal: Will remain free from infection Outcome: Progressing Goal: Diagnostic test results will improve Outcome: Progressing Goal: Respiratory complications will improve Outcome: Progressing Goal: Cardiovascular complication will be avoided Outcome: Progressing   Problem: Activity: Goal: Risk for activity intolerance will decrease Outcome: Progressing   Problem: Nutrition: Goal: Adequate nutrition will be maintained Outcome: Progressing   Problem: Coping: Goal: Level of anxiety will decrease Outcome: Progressing   Problem: Pain Managment: Goal: General experience of comfort will improve Outcome: Progressing   Problem: Skin Integrity: Goal: Risk for impaired skin integrity will decrease Outcome: Progressing  L hip incision continues to bleed- shown to Dr Ella Jubilee this am - pt denies pain at rest but has discomfort she cannot rate with movement .  Tylenol scheduled for management of pain.

## 2020-03-31 NOTE — Progress Notes (Signed)
Subjective:  POD #5 s/p left hip hemiarthroplasty.  Patient confused but in no acute distress.  Patient has received 1 unit of PRBCs for symptomatic anemia.  Objective:   VITALS:   Vitals:   03/30/20 1833 03/30/20 1858 03/30/20 2323 03/31/20 0810  BP: (!) 112/49 130/72 104/77 (!) 136/56  Pulse: 82 86 90 76  Resp: 18  17 20   Temp: 98.6 F (37 C) 98.1 F (36.7 C) 98.6 F (37 C) 98.6 F (37 C)  TempSrc: Oral Oral Oral Oral  SpO2: 96% 92% 96% 90%  Weight:      Height:        PHYSICAL EXAM: Left lower extremity: I personally change the patient's dressing today.  Patient had moderate serosanguineous drainage.  Patient has extensive ecchymosis over the left thigh.  There is no active drainage from the incision.  Patient's thigh compartments are soft and compressible.  Patient can flex and extend her toes and dorsiflex and plantarflex her ankle.  She has palpable pedal pulses and intact sensation light touch throughout the left lower extremity.   LABS  Results for orders placed or performed during the hospital encounter of 03/26/20 (from the past 24 hour(s))  Hemoglobin and hematocrit, blood     Status: Abnormal   Collection Time: 03/31/20  5:56 AM  Result Value Ref Range   Hemoglobin 8.1 (L) 12.0 - 15.0 g/dL   HCT 04/02/20 (L) 36 - 46 %    CT FEMUR LEFT WO CONTRAST  Result Date: 03/31/2020 CLINICAL DATA:  Left thigh hematoma. Recent left hip hemiarthroplasty for fracture 5 days ago. EXAM: CT OF THE LOWER LEFT EXTREMITY WITHOUT CONTRAST TECHNIQUE: Multidetector CT imaging of the lower left extremity was performed according to the standard protocol. COMPARISON:  Left rib x-rays dated March 26, 2020. FINDINGS: Bones/Joint/Cartilage Interval left hip hemiarthroplasty. No evidence of hardware failure or loosening. No fracture or dislocation. Degenerative changes of the pubic symphysis and the knees. No joint effusion. Osteopenia. Ligaments Ligaments are suboptimally evaluated by CT. Muscles  and Tendons Atrophy of the left gluteus minimus and medius muscles. Small foci of postsurgical subcutaneous emphysema in the left iliacus muscle. Soft tissue Irregular 6.7 x 3.2 x 21.3 cm partially organized hyperdense collection overlying the superficial fascia extending from the mid iliac bone to tip of the femoral stem (series 6, image 49). No low-density fluid collection. No soft tissue mass. Large amount of stool in the rectum. IMPRESSION: 1. Irregular 6.7 x 3.2 x 21.3 cm partially organized hematoma overlying the superficial fascia of the left hip and upper thigh extending from the mid iliac bone to tip of the femoral stem. 2. Interval left hip hemiarthroplasty without evidence of hardware complication. No acute osseous abnormality. Electronically Signed   By: March 28, 2020 M.D.   On: 03/31/2020 10:18    Assessment/Plan: 5 Days Post-Op   Principal Problem:   Left displaced femoral neck fracture (HCC) Active Problems:   Dementia without behavioral disturbance (HCC)   Accidental fall   Preoperative clearance   Essential hypertension   Primary osteoarthritis involving multiple joints   Protein-calorie malnutrition, severe   Hematoma of left thigh  Given the patient's ecchymosis, I would hold on anticoagulation therapy for now.  Once her hemoglobin stabilizes she may be placed on enteric-coated aspirin 325 mg p.o. twice daily for DVT prophylaxis instead of Lovenox which may be contributing to her ecchymosis.  Continue physical therapy with posterior hip precautions as medically appropriate.  Continue to monitor hemoglobin.  Juanell Fairly , MD 03/31/2020, 12:12 PM

## 2020-03-31 NOTE — Progress Notes (Signed)
Physical Therapy Treatment Patient Details Name: Kayla Fowler MRN: [id number] DOB: 01/28/1927 Today's Date: 03/31/2020    History of Present Illness 86 y/o female suffered an unwitnessed fall with L hip fracture and subsequent hemiarthroplasty    PT Comments    Pt was supine in bed upon arriving. She is alert but confused and disoriented. Did agree to session and OOB activity. Pt stood EOB 2 x with max assist + max vcs. Unable to adhere to proper wt bearing. Did also perform and tolerate HEP handout. Overall limited progress made. Recommend SNF at DC to address deficits and progress safe functional mobility.    Follow Up Recommendations  SNF     Equipment Recommendations  Other (comment) (defer to next level of care)    Recommendations for Other Services       Precautions / Restrictions Precautions Precautions: Posterior Hip Precaution Booklet Issued: No Restrictions Weight Bearing Restrictions: Yes LLE Weight Bearing: Partial weight bearing    Mobility  Bed Mobility Overal bed mobility: Needs Assistance Bed Mobility: Supine to Sit;Sit to Supine     Supine to sit: Max assist Sit to supine: Max assist      Transfers Overall transfer level: Needs assistance Equipment used: Rolling walker (2 wheeled) Transfers: Sit to/from Stand Sit to Stand: Max assist;From elevated surface         General transfer comment: Max assist + max vcs. unable to adhere to wt bearing precautions  Ambulation/Gait             General Gait Details: unsafe/unable to progress away from EOB. could not adhere to wt bearing restrictions   Stairs             Wheelchair Mobility    Modified Rankin (Stroke Patients Only)       Balance Overall balance assessment: Needs assistance Sitting-balance support: Bilateral upper extremity supported Sitting balance-Leahy Scale: Fair Sitting balance - Comments: Pt sat EOB x > 8 minutes   Standing balance support: Bilateral upper  extremity supported;During functional activity Standing balance-Leahy Scale: Poor Standing balance comment: max assist + max vcs to stand for less than 3 sec.                            Cognition Arousal/Alertness: Awake/alert Behavior During Therapy: Anxious Overall Cognitive Status: History of cognitive impairments - at baseline                                 General Comments: Pt was cooperative but has baseline dementia. was able to follow commands with increased time to process      Exercises Total Joint Exercises Ankle Circles/Pumps: AROM;Both Quad Sets: AROM;Both Heel Slides: AAROM;10 reps;Right Hip ABduction/ADduction: AAROM Straight Leg Raises: AAROM    General Comments        Pertinent Vitals/Pain Pain Assessment: No/denies pain Pain Score: 0-No pain Pain Intervention(s): Limited activity within patient's tolerance;Premedicated before session;Monitored during session;Repositioned    Home Living                      Prior Function            PT Goals (current goals can now be found in the care plan section) Acute Rehab PT Goals Patient Stated Goal: unable to confirm Progress towards PT goals: Progressing toward goals    Frequency    7X/week  PT Plan Current plan remains appropriate    Co-evaluation              AM-PAC PT "6 Clicks" Mobility   Outcome Measure  Help needed turning from your back to your side while in a flat bed without using bedrails?: A Lot Help needed moving from lying on your back to sitting on the side of a flat bed without using bedrails?: A Lot Help needed moving to and from a bed to a chair (including a wheelchair)?: A Lot Help needed standing up from a chair using your arms (e.g., wheelchair or bedside chair)?: A Lot Help needed to walk in hospital room?: Total Help needed climbing 3-5 steps with a railing? : Total 6 Click Score: 10    End of Session Equipment Utilized During  Treatment: Gait belt Activity Tolerance: Patient limited by fatigue Patient left: in bed;with call bell/phone within reach;with bed alarm set;with SCD's reapplied Nurse Communication: Mobility status PT Visit Diagnosis: Muscle weakness (generalized) (M62.81);Difficulty in walking, not elsewhere classified (R26.2);Pain Pain - Right/Left: Left Pain - part of body: Hip     Time: 4268-3419 PT Time Calculation (min) (ACUTE ONLY): 12 min  Charges:  $Therapeutic Exercise: 8-22 mins                     Jetta Lout PTA 03/31/20, 5:06 PM

## 2020-04-01 LAB — CBC WITH DIFFERENTIAL/PLATELET
Abs Immature Granulocytes: 0.06 10*3/uL (ref 0.00–0.07)
Basophils Absolute: 0 10*3/uL (ref 0.0–0.1)
Basophils Relative: 0 %
Eosinophils Absolute: 0.4 10*3/uL (ref 0.0–0.5)
Eosinophils Relative: 4 %
HCT: 26.6 % — ABNORMAL LOW (ref 36.0–46.0)
Hemoglobin: 8.9 g/dL — ABNORMAL LOW (ref 12.0–15.0)
Immature Granulocytes: 1 %
Lymphocytes Relative: 15 %
Lymphs Abs: 1.3 10*3/uL (ref 0.7–4.0)
MCH: 31.3 pg (ref 26.0–34.0)
MCHC: 33.5 g/dL (ref 30.0–36.0)
MCV: 93.7 fL (ref 80.0–100.0)
Monocytes Absolute: 0.9 10*3/uL (ref 0.1–1.0)
Monocytes Relative: 10 %
Neutro Abs: 6.1 10*3/uL (ref 1.7–7.7)
Neutrophils Relative %: 70 %
Platelets: 361 10*3/uL (ref 150–400)
RBC: 2.84 MIL/uL — ABNORMAL LOW (ref 3.87–5.11)
RDW: 13.5 % (ref 11.5–15.5)
WBC: 8.8 10*3/uL (ref 4.0–10.5)
nRBC: 0.2 % (ref 0.0–0.2)

## 2020-04-01 LAB — CULTURE, BLOOD (ROUTINE X 2)
Culture: NO GROWTH
Culture: NO GROWTH
Special Requests: ADEQUATE

## 2020-04-01 LAB — BASIC METABOLIC PANEL
Anion gap: 8 (ref 5–15)
BUN: 10 mg/dL (ref 8–23)
CO2: 33 mmol/L — ABNORMAL HIGH (ref 22–32)
Calcium: 8.5 mg/dL — ABNORMAL LOW (ref 8.9–10.3)
Chloride: 99 mmol/L (ref 98–111)
Creatinine, Ser: 0.57 mg/dL (ref 0.44–1.00)
GFR calc Af Amer: >60 mL/min (ref >60)
GFR calc non Af Amer: >60 mL/min (ref >60)
Glucose, Bld: 102 mg/dL — ABNORMAL HIGH (ref 70–99)
Potassium: 3.2 mmol/L — ABNORMAL LOW (ref 3.5–5.1)
Sodium: 140 mmol/L (ref 135–145)

## 2020-04-01 LAB — SARS CORONAVIRUS 2 BY RT PCR (HOSPITAL ORDER, PERFORMED IN ~~LOC~~ HOSPITAL LAB): SARS Coronavirus 2: NEGATIVE

## 2020-04-01 MED ORDER — POTASSIUM CHLORIDE 10 MEQ/100ML IV SOLN
10.0000 meq | INTRAVENOUS | Status: AC
  Administered 2020-04-01 (×2): 10 meq via INTRAVENOUS
  Filled 2020-04-01 (×2): qty 100

## 2020-04-01 MED ORDER — POTASSIUM CHLORIDE 20 MEQ PO PACK
20.0000 meq | PACK | Freq: Once | ORAL | Status: AC
  Administered 2020-04-01: 20 meq via ORAL
  Filled 2020-04-01: qty 1

## 2020-04-01 NOTE — Progress Notes (Signed)
Subjective:  POD #6 s/p left hip hemiarthroplasty.   Patient is confused and unable to provide a history today.  She is lying in bed in no acute distress.  Objective:   VITALS:   Vitals:   03/31/20 0810 03/31/20 1613 03/31/20 2340 04/01/20 0803  BP: (!) 136/56 (!) 121/55 (!) 133/52 (!) 130/55  Pulse: 76 77 (!) 51 85  Resp: 20 16 18 18   Temp: 98.6 F (37 C) 98.6 F (37 C) 98.2 F (36.8 C) 98.8 F (37.1 C)  TempSrc: Oral Oral Oral Oral  SpO2: 90% 95% (!) 88% 90%  Weight:      Height:        PHYSICAL EXAM: Left lower extremity: Patient not following commands today.  Distal pulses are palpable.  Patient's thigh compartments remain soft and compressible despite significant ecchymosis.  Patient has mild serosanguineous range on her dressings that were changed yesterday.  LABS  Results for orders placed or performed during the hospital encounter of 03/26/20 (from the past 24 hour(s))  CBC with Differential/Platelet     Status: Abnormal   Collection Time: 04/01/20  6:40 AM  Result Value Ref Range   WBC 8.8 4.0 - 10.5 K/uL   RBC 2.84 (L) 3.87 - 5.11 MIL/uL   Hemoglobin 8.9 (L) 12.0 - 15.0 g/dL   HCT 04/03/20 (L) 36 - 46 %   MCV 93.7 80.0 - 100.0 fL   MCH 31.3 26.0 - 34.0 pg   MCHC 33.5 30.0 - 36.0 g/dL   RDW 49.7 02.6 - 37.8 %   Platelets 361 150 - 400 K/uL   nRBC 0.2 0.0 - 0.2 %   Neutrophils Relative % 70 %   Neutro Abs 6.1 1.7 - 7.7 K/uL   Lymphocytes Relative 15 %   Lymphs Abs 1.3 0.7 - 4.0 K/uL   Monocytes Relative 10 %   Monocytes Absolute 0.9 0 - 1 K/uL   Eosinophils Relative 4 %   Eosinophils Absolute 0.4 0 - 0 K/uL   Basophils Relative 0 %   Basophils Absolute 0.0 0 - 0 K/uL   Immature Granulocytes 1 %   Abs Immature Granulocytes 0.06 0.00 - 0.07 K/uL  Basic metabolic panel     Status: Abnormal   Collection Time: 04/01/20  6:40 AM  Result Value Ref Range   Sodium 140 135 - 145 mmol/L   Potassium 3.2 (L) 3.5 - 5.1 mmol/L   Chloride 99 98 - 111 mmol/L   CO2 33  (H) 22 - 32 mmol/L   Glucose, Bld 102 (H) 70 - 99 mg/dL   BUN 10 8 - 23 mg/dL   Creatinine, Ser 04/03/20 0.44 - 1.00 mg/dL   Calcium 8.5 (L) 8.9 - 10.3 mg/dL   GFR calc non Af Amer >60 >60 mL/min   GFR calc Af Amer >60 >60 mL/min   Anion gap 8 5 - 15    CT FEMUR LEFT WO CONTRAST  Result Date: 03/31/2020 CLINICAL DATA:  Left thigh hematoma. Recent left hip hemiarthroplasty for fracture 5 days ago. EXAM: CT OF THE LOWER LEFT EXTREMITY WITHOUT CONTRAST TECHNIQUE: Multidetector CT imaging of the lower left extremity was performed according to the standard protocol. COMPARISON:  Left rib x-rays dated March 26, 2020. FINDINGS: Bones/Joint/Cartilage Interval left hip hemiarthroplasty. No evidence of hardware failure or loosening. No fracture or dislocation. Degenerative changes of the pubic symphysis and the knees. No joint effusion. Osteopenia. Ligaments Ligaments are suboptimally evaluated by CT. Muscles and Tendons Atrophy of the left  gluteus minimus and medius muscles. Small foci of postsurgical subcutaneous emphysema in the left iliacus muscle. Soft tissue Irregular 6.7 x 3.2 x 21.3 cm partially organized hyperdense collection overlying the superficial fascia extending from the mid iliac bone to tip of the femoral stem (series 6, image 49). No low-density fluid collection. No soft tissue mass. Large amount of stool in the rectum. IMPRESSION: 1. Irregular 6.7 x 3.2 x 21.3 cm partially organized hematoma overlying the superficial fascia of the left hip and upper thigh extending from the mid iliac bone to tip of the femoral stem. 2. Interval left hip hemiarthroplasty without evidence of hardware complication. No acute osseous abnormality. Electronically Signed   By: Obie Dredge M.D.   On: 03/31/2020 10:18    Assessment/Plan: 6 Days Post-Op   Principal Problem:   Left displaced femoral neck fracture (HCC) Active Problems:   Dementia without behavioral disturbance (HCC)   Accidental fall    Preoperative clearance   Essential hypertension   Primary osteoarthritis involving multiple joints   Protein-calorie malnutrition, severe   Hematoma of left thigh  Patient's hemoglobin has improved today.  Patient may perform physical therapy as medically appropriate.  Patient may be discharged from an orthopedic standpoint when cleared medically.  Patient will follow up with Dr. Hyacinth Meeker in clinic in 10 to 14 days.  Will defer to medicine as to when it is appropriate to restart anticoagulation therapy.  Enteric-coated aspirin 325 mg p.o. twice daily would be an alternative to Lovenox which may be leading to her significant thigh ecchymosis.    Juanell Fairly , MD 04/01/2020, 11:28 AM

## 2020-04-01 NOTE — Progress Notes (Signed)
While Kayla Fowler < PT working with patient this morning patient stated she wanted to " kill herself" Dr. Ella Jubilee is aware , this writer went in with patient and ask her did she have any intention of harming herself She states " no " then attempt to cover her up with blanket she states " I do not want it " . Placed blanket beside her and left bedside , patient is alert and oriented x1 .

## 2020-04-01 NOTE — Progress Notes (Signed)
PROGRESS NOTE    Kayla Fowler  QPY:[id number] DOB: 07/29/1927 DOA: 03/26/2020 PCP: Theodosia Blender, MD    Brief Narrative:  Patient admitted to the hospital with left displaced femoral neck fracture, sp arthroplasty. Now complicated with left hip hematoma with acute blood loss anemia.  86 year old female past medical history for dementia who presents after a unwitnessed mechanical fall. Post trauma she had significant left hip pain. Limited history due to cognitive impairment. On her initial physical examination blood pressure 146/81, heart rate 91, respiratory rate16, temperature 98.9, oxygen saturation 95%.Lungs clear to auscultation bilaterally, heart S1-S2, present rhythm, soft abdomen, no lower extremity edema, left leg was shortened. Sodium 139, potassium 3.9, chloride 101, bicarb 26, glucose 125, BUN 23, creatinine 0.81, white count 10.6, hemoglobin 12.2, hematocrit 37.1, platelets 165. Left hip films with transcervical left femoral neck fracture with marked varus angulation.  Patient received analgesics and underwent left hip bipolar arthroplasty on 8/23.  Patient very weak and deconditioned, noted to have acute on chronic anemia post-operative.   She developed a larger left thigh hematoma with acute blood loss anemia, required one unit PRBC transfusion.    Assessment & Plan:   Principal Problem:   Left displaced femoral neck fracture (HCC) Active Problems:   Dementia without behavioral disturbance (HCC)   Accidental fall   Preoperative clearance   Essential hypertension   Primary osteoarthritis involving multiple joints   Protein-calorie malnutrition, severe   Hematoma of left thigh   1. Left femoral neck fracture. Sp left hip arthroplasty 08/23.NEW complicated with large left thigh hematoma.  CT left thigh with 6,7x 3.2x 21.3 cm partially organized hematoma. Hgb this am is  8,9 after one unit PRBC.   Will continue pain controlwith acetaminophen and  hydrocodone with good toleration. Holding on pharmacological DVT prophylaxis for now.  Continue physical/ occupation therapy. If Hgb stable in 24 H will plan transfer to SNF in am. Will get COVID 19 screen today. Patient will need follow up CT left thigh as outpatient to follow up on resolution of hematoma.   2. Metabolic encephalopathy. Patient with confusion and episodic agitation, not responsive to haloperidol, she has been transitioned to quetiapine with good toleration.  On nutritional support, with dysphagia 1 diet with aspiration precautions.  3. HTN.controlled, currently holding on blood pressure medications.   4. Osteoarthritis.pending transfer to SNF for pt and ot. .   5. Dementia/ depression.Continue withduloxetineand mirtazapine.  6. Iron deficiency anemia. serum iron is 12, tibc is 179, transferrin saturation is 7 and ferritin I 156 with transferrin 133.   Sp IV Iron with good toleration, plan to follow up on iron panel as outpatient for further iron infusion.   7. Severe malnutrition. Nutritional supplementation, patient with very poor oral intake.   8. New hypokalemia. Renal function this am with serum cr at 0,57 with K down to 3,2 and serum bicarbonate at 33.  Will correct K with IV kcl 40 meq and follow up renal function and electrolytes in am.   Status is: Inpatient  Remains inpatient appropriate because:Inpatient level of care appropriate due to severity of illness   Dispo: The patient is from: SNF              Anticipated d/c is to: SNF              Anticipated d/c date is: 1 day              Patient currently is not medically stable to d/c.  DVT prophylaxis: scd   Code Status:   full  Family Communication:  No family at the bedside      Nutrition Status: Nutrition Problem: Severe Malnutrition Etiology: social / environmental circumstances (dementia, advanced age) Signs/Symptoms: severe fat depletion, severe muscle  depletion Interventions: Ensure Enlive (each supplement provides 350kcal and 20 grams of protein), Magic cup, MVI   Consultants:  orthopedics  Procedures:  Left hip arthroplasty   Subjective: Patient has been confused and disorientated, agitation has improved. No nausea or vomiting, continue to have poor oral intake.   Objective: Vitals:   03/31/20 0810 03/31/20 1613 03/31/20 2340 04/01/20 0803  BP: (!) 136/56 (!) 121/55 (!) 133/52 (!) 130/55  Pulse: 76 77 (!) 51 85  Resp: 20 16 18 18   Temp: 98.6 F (37 C) 98.6 F (37 C) 98.2 F (36.8 C) 98.8 F (37.1 C)  TempSrc: Oral Oral Oral Oral  SpO2: 90% 95% (!) 88% 90%  Weight:      Height:        Intake/Output Summary (Last 24 hours) at 04/01/2020 1035 Last data filed at 03/31/2020 1900 Gross per 24 hour  Intake 530 ml  Output --  Net 530 ml   Filed Weights   03/26/20 0311 03/26/20 1654  Weight: 63.5 kg 63.5 kg    Examination:   General: Not in pain or dyspnea, deconditioned  Neurology: Awake and alert, non focal  E ENT: mild pallor, no icterus, oral mucosa moist Cardiovascular: No JVD. S1-S2 present, rhythmic, no gallops, rubs, or murmurs. No lower extremity edema. Pulmonary: positive breath sounds bilaterally, adequate air movement, no wheezing, rhonchi or rales. Gastrointestinal. Abdomen soft and non tender Skin. Large deformity on left thigh with positive hematoma. Non tender to palpation.  Musculoskeletal: no joint deformities     Data Reviewed: I have personally reviewed following labs and imaging studies  CBC: Recent Labs  Lab 03/27/20 0835 03/27/20 0835 03/28/20 0614 03/29/20 1035 03/30/20 0637 03/31/20 0556 04/01/20 0640  WBC 7.2  --  13.2* 11.7* 10.4  --  8.8  NEUTROABS  --   --   --   --  8.2*  --  6.1  HGB 10.8*   < > 8.3* 7.8* 7.0* 8.1* 8.9*  HCT 33.2*   < > 25.1* 24.2* 21.3* 23.9* 26.6*  MCV 97.1  --  97.7 98.4 96.4  --  93.7  PLT 215  --  186 216 257  --  361   < > = values in this  interval not displayed.   Basic Metabolic Panel: Recent Labs  Lab 03/26/20 0316 03/27/20 0835 03/28/20 0614 03/30/20 0637 04/01/20 0640  NA 139 134* 136 139 140  K 3.9 3.4* 4.1 3.8 3.2*  CL 101 97* 100 101 99  CO2 26 27 27 31  33*  GLUCOSE 125* 112* 115* 151* 102*  BUN 23 17 23 12 10   CREATININE 0.81 0.72 0.92 0.61 0.57  CALCIUM 9.3 8.3* 8.0* 8.7* 8.5*   GFR: Estimated Creatinine Clearance: 42.9 mL/min (by C-G formula based on SCr of 0.57 mg/dL). Liver Function Tests: Recent Labs  Lab 03/26/20 0316  AST 31  ALT 16  ALKPHOS 62  BILITOT 1.0  PROT 7.3  ALBUMIN 4.0   No results for input(s): LIPASE, AMYLASE in the last 168 hours. No results for input(s): AMMONIA in the last 168 hours. Coagulation Profile: Recent Labs  Lab 03/26/20 0316  INR 0.9   Cardiac Enzymes: No results for input(s): CKTOTAL, CKMB, CKMBINDEX, TROPONINI in the  last 168 hours. BNP (last 3 results) No results for input(s): PROBNP in the last 8760 hours. HbA1C: No results for input(s): HGBA1C in the last 72 hours. CBG: No results for input(s): GLUCAP in the last 168 hours. Lipid Profile: No results for input(s): CHOL, HDL, LDLCALC, TRIG, CHOLHDL, LDLDIRECT in the last 72 hours. Thyroid Function Tests: No results for input(s): TSH, T4TOTAL, FREET4, T3FREE, THYROIDAB in the last 72 hours. Anemia Panel: Recent Labs    03/30/20 0637  FERRITIN 156  TIBC 179*  IRON 12*      Radiology Studies: I have reviewed all of the imaging during this hospital visit personally     Scheduled Meds: . acetaminophen  500 mg Oral Q8H  . chlorhexidine  1 application Topical Once  . cholecalciferol  2,000 Units Oral Daily  . docusate sodium  100 mg Oral BID  . DULoxetine  40 mg Oral Daily  . feeding supplement (NEPRO CARB STEADY)  237 mL Oral BID BM  . melatonin  2.5 mg Oral QHS  . mirtazapine  7.5 mg Oral QHS  . multivitamin with minerals  1 tablet Oral Daily  . nystatin  5 mL Mouth/Throat QID  .  QUEtiapine  25 mg Oral BID   Continuous Infusions:   LOS: 6 days        Analys Ryden Annett Gula, MD

## 2020-04-01 NOTE — Progress Notes (Signed)
Patient received about 1 and a half bags of potassium and then when this writer went in to check on patient she had pulled her IV out . Notified Dr. Ella Jubilee and will place IV consult

## 2020-04-01 NOTE — Plan of Care (Signed)
  Problem: Education: Goal: Knowledge of General Education information will improve Description: Including pain rating scale, medication(s)/side effects and non-pharmacologic comfort measures Outcome: Not Progressing   Problem: Health Behavior/Discharge Planning: Goal: Ability to manage health-related needs will improve Outcome: Not Progressing   Problem: Clinical Measurements: Goal: Ability to maintain clinical measurements within normal limits will improve Outcome: Not Progressing Goal: Will remain free from infection Outcome: Progressing Goal: Diagnostic test results will improve Outcome: Progressing Goal: Respiratory complications will improve Outcome: Progressing Goal: Cardiovascular complication will be avoided Outcome: Progressing   Problem: Activity: Goal: Risk for activity intolerance will decrease Outcome: Not Progressing   Problem: Nutrition: Goal: Adequate nutrition will be maintained Outcome: Not Progressing   Problem: Coping: Goal: Level of anxiety will decrease Outcome: Progressing   Problem: Elimination: Goal: Will not experience complications related to bowel motility Outcome: Progressing Goal: Will not experience complications related to urinary retention Outcome: Progressing   Problem: Pain Managment: Goal: General experience of comfort will improve Outcome: Progressing   Problem: Safety: Goal: Ability to remain free from injury will improve Outcome: Progressing   Problem: Skin Integrity: Goal: Risk for impaired skin integrity will decrease Outcome: Progressing   

## 2020-04-01 NOTE — TOC Progression Note (Signed)
Transition of Care [facility]) - Progression Note    Patient Details  Name: Brazil Voytko MRN: [id number] Date of Birth: 1927-04-08  Transition of Care [facility]) CM/SW Contact  Aimy Sweeting, [name], [name] Phone Number: 04/01/2020, 11:50 AM  Clinical Narrative:     Per MD patient is not medically stable for discharge today. Iron and hemoglobin imoroved. New problem- renal function and hypokalemia. Discharge plan- SNF for rehab.  TOC team to continue with discharge planning.         Expected Discharge Plan and Services                                                 Social Determinants of Health (SDOH) Interventions    Readmission Risk Interventions No flowsheet data found.

## 2020-04-01 NOTE — Progress Notes (Signed)
Patients o2 sat noted to be 89%, this writer placed her PRN 2l on her and Spo2 is now 95% with 2L via [place]

## 2020-04-01 NOTE — Progress Notes (Signed)
Attempt to give patient a drink of carb steady she states " I do not like that it taste like spoiled milk " .

## 2020-04-01 NOTE — Progress Notes (Signed)
Physical Therapy Treatment Patient Details Name: Kayla Fowler MRN: [id number] DOB: [birth year] Today's Date: 04/01/2020    History of Present Illness 86 y/o female suffered an unwitnessed fall with L hip fracture and subsequent hemiarthroplasty    PT Comments    Pt in bed, tech arrived in room at the same time.  Pt inc urine and BM.  Care provided in supine with max a.  Transitioned to sitting with max a x 1 and is able to stand at bedside with RW and max a x 1 but while doing so, she has a BM.  Sat on EOB and commode is brought.  She is transferred with max a x 1 to commode where sh she is able to continue with BM.  While sitting on commode she does state "I want to kill myself."  Encouragement is given.  She does state the pain is unbearable and she wants to get back in bed.  Transferred back with max a x 1.  MD and LPN notified of statement.   Follow Up Recommendations  SNF     Equipment Recommendations       Recommendations for Other Services       Precautions / Restrictions Precautions Precautions: Posterior Hip Precaution Booklet Issued: No Restrictions Weight Bearing Restrictions: Yes LLE Weight Bearing: Partial weight bearing    Mobility  Bed Mobility Overal bed mobility: Needs Assistance Bed Mobility: Supine to Sit;Sit to Supine;Rolling Rolling: Independent   Supine to sit: Max assist Sit to supine: Max assist      Transfers Overall transfer level: Needs assistance Equipment used: Rolling walker (2 wheeled);None Transfers: Sit to/from [employer] Sit to Stand: Max assist;From elevated surface Stand pivot transfers: Max assist       General transfer comment: standing at bedside with walker and stnd pivot to commode  Ambulation/Gait             General Gait Details: unsafe/unable to progress away from EOB. could not adhere to wt bearing restrictions   Stairs             Wheelchair Mobility    Modified Rankin (Stroke  Patients Only)       Balance Overall balance assessment: Needs assistance Sitting-balance support: Bilateral upper extremity supported Sitting balance-Leahy Scale: Fair     Standing balance support: Bilateral upper extremity supported;During functional activity Standing balance-Leahy Scale: Poor                              Cognition Arousal/Alertness: Awake/alert Behavior During Therapy: Anxious Overall Cognitive Status: History of cognitive impairments - at baseline                                 General Comments: Pt was cooperative but has baseline dementia. was able to follow commands with increased time to process      Exercises      General Comments        Pertinent Vitals/Pain Pain Assessment: Faces Faces Pain Scale: Hurts even more Pain Location: L hip Pain Descriptors / Indicators: Sore;Grimacing Pain Intervention(s): Limited activity within patient's tolerance;Monitored during session;Repositioned    Home Living                      Prior Function            PT Goals (current goals can now  be found in the care plan section) Progress towards PT goals: Progressing toward goals    Frequency    7X/week      PT Plan Current plan remains appropriate    Co-evaluation              AM-PAC PT "6 Clicks" Mobility   Outcome Measure  Help needed turning from your back to your side while in a flat bed without using bedrails?: A Lot Help needed moving from lying on your back to sitting on the side of a flat bed without using bedrails?: A Lot Help needed moving to and from a bed to a chair (including a wheelchair)?: A Lot Help needed standing up from a chair using your arms (e.g., wheelchair or bedside chair)?: A Lot Help needed to walk in hospital room?: Total Help needed climbing 3-5 steps with a railing? : Total 6 Click Score: 10    End of Session Equipment Utilized During Treatment: Gait belt Activity  Tolerance: Patient limited by fatigue;Patient limited by pain Patient left: in bed;with call bell/phone within reach;with bed alarm set;with SCD's reapplied;with nursing/sitter in room Nurse Communication: Mobility status Pain - Right/Left: Left Pain - part of body: Hip     Time: 2500-3704 PT Time Calculation (min) (ACUTE ONLY): 11 min  Charges:  $Therapeutic Activity: 8-22 mins                    Danielle Dess, PTA 04/01/20, 11:23 AM

## 2020-04-02 LAB — CBC WITH DIFFERENTIAL/PLATELET
Abs Immature Granulocytes: 0.09 10*3/uL — ABNORMAL HIGH (ref 0.00–0.07)
Basophils Absolute: 0 10*3/uL (ref 0.0–0.1)
Basophils Relative: 0 %
Eosinophils Absolute: 0.4 10*3/uL (ref 0.0–0.5)
Eosinophils Relative: 5 %
HCT: 25.4 % — ABNORMAL LOW (ref 36.0–46.0)
Hemoglobin: 8.1 g/dL — ABNORMAL LOW (ref 12.0–15.0)
Immature Granulocytes: 1 %
Lymphocytes Relative: 23 %
Lymphs Abs: 2 10*3/uL (ref 0.7–4.0)
MCH: 31.2 pg (ref 26.0–34.0)
MCHC: 31.9 g/dL (ref 30.0–36.0)
MCV: 97.7 fL (ref 80.0–100.0)
Monocytes Absolute: 0.8 10*3/uL (ref 0.1–1.0)
Monocytes Relative: 9 %
Neutro Abs: 5.4 10*3/uL (ref 1.7–7.7)
Neutrophils Relative %: 62 %
Platelets: 390 10*3/uL (ref 150–400)
RBC: 2.6 MIL/uL — ABNORMAL LOW (ref 3.87–5.11)
RDW: 13.6 % (ref 11.5–15.5)
WBC: 8.6 10*3/uL (ref 4.0–10.5)
nRBC: 0.3 % — ABNORMAL HIGH (ref 0.0–0.2)

## 2020-04-02 LAB — BASIC METABOLIC PANEL
Anion gap: 7 (ref 5–15)
BUN: 19 mg/dL (ref 8–23)
CO2: 33 mmol/L — ABNORMAL HIGH (ref 22–32)
Calcium: 8.2 mg/dL — ABNORMAL LOW (ref 8.9–10.3)
Chloride: 101 mmol/L (ref 98–111)
Creatinine, Ser: 0.72 mg/dL (ref 0.44–1.00)
GFR calc Af Amer: >60 mL/min (ref >60)
GFR calc non Af Amer: >60 mL/min (ref >60)
Glucose, Bld: 96 mg/dL (ref 70–99)
Potassium: 3.4 mmol/L — ABNORMAL LOW (ref 3.5–5.1)
Sodium: 141 mmol/L (ref 135–145)

## 2020-04-02 MED ORDER — HYDROCODONE-ACETAMINOPHEN 5-325 MG PO TABS
1.0000 | ORAL_TABLET | Freq: Three times a day (TID) | ORAL | 0 refills | Status: AC | PRN
Start: 2020-04-02 — End: ?

## 2020-04-02 MED ORDER — ACETAMINOPHEN 500 MG PO TABS: 500.0000 mg | ORAL_TABLET | Freq: Four times a day (QID) | ORAL | 0 refills | Status: AC | PRN

## 2020-04-02 MED ORDER — POTASSIUM CHLORIDE 20 MEQ PO PACK
40.0000 meq | PACK | Freq: Once | ORAL | Status: AC
  Administered 2020-04-02: 40 meq via ORAL
  Filled 2020-04-02: qty 2

## 2020-04-02 MED ORDER — NEPRO/CARBSTEADY PO LIQD: 237.0000 mL | Freq: Two times a day (BID) | ORAL | 0 refills | Status: AC

## 2020-04-02 NOTE — Progress Notes (Signed)
Physical Therapy Treatment Patient Details Name: Kayla Fowler MRN: [id number] DOB: October 08, 1926 Today's Date: 04/02/2020    History of Present Illness 86 y/o female suffered an unwitnessed fall with L hip fracture and subsequent hemiarthroplasty    PT Comments    Participated in exercises as described below.  OOB and transferred to recliner at bedside with max a x 1.  Better tolerance to mobility today and was comfortable in chair this am for the first time.  Follow Up Recommendations  SNF     Equipment Recommendations       Recommendations for Other Services       Precautions / Restrictions Precautions Precautions: Posterior Hip Precaution Booklet Issued: No Restrictions Weight Bearing Restrictions: Yes LLE Weight Bearing: Partial weight bearing    Mobility  Bed Mobility Overal bed mobility: Needs Assistance Bed Mobility: Supine to Sit     Supine to sit: Max assist        Transfers Overall transfer level: Needs assistance Equipment used: None Transfers: Sit to/from [employer] Sit to Stand: Mod assist;Max assist Stand pivot transfers: Mod assist;Max assist       General transfer comment: does well with stnd pivot transfer today, no resistance  Ambulation/Gait                 Stairs             Wheelchair Mobility    Modified Rankin (Stroke Patients Only)       Balance Overall balance assessment: Needs assistance Sitting-balance support: Bilateral upper extremity supported Sitting balance-Leahy Scale: Fair     Standing balance support: Bilateral upper extremity supported;During functional activity Standing balance-Leahy Scale: Poor                              Cognition Arousal/Alertness: Awake/alert Behavior During Therapy: Anxious Overall Cognitive Status: History of cognitive impairments - at baseline                                 General Comments: Pt was cooperative but has  baseline dementia. was able to follow commands with increased time to process      Exercises Total Joint Exercises Ankle Circles/Pumps: AROM;Both Quad Sets: AROM;Both Heel Slides: AAROM;10 reps;Right Hip ABduction/ADduction: AAROM Straight Leg Raises: AAROM    General Comments        Pertinent Vitals/Pain Pain Assessment: Faces Faces Pain Scale: Hurts little more Pain Location: L hip Pain Descriptors / Indicators: Sore;Grimacing Pain Intervention(s): Limited activity within patient's tolerance;Monitored during session;Repositioned    Home Living                      Prior Function            PT Goals (current goals can now be found in the care plan section) Progress towards PT goals: Progressing toward goals    Frequency    7X/week      PT Plan Current plan remains appropriate    Co-evaluation              AM-PAC PT "6 Clicks" Mobility   Outcome Measure  Help needed turning from your back to your side while in a flat bed without using bedrails?: A Lot Help needed moving from lying on your back to sitting on the side of a flat bed without using bedrails?: A Lot Help  needed moving to and from a bed to a chair (including a wheelchair)?: A Lot Help needed standing up from a chair using your arms (e.g., wheelchair or bedside chair)?: A Lot Help needed to walk in hospital room?: Total Help needed climbing 3-5 steps with a railing? : Total 6 Click Score: 10    End of Session Equipment Utilized During Treatment: Gait belt Activity Tolerance: Patient tolerated treatment well Patient left: with call bell/phone within reach;with nursing/sitter in room;in chair;with chair alarm set Nurse Communication: Mobility status Pain - Right/Left: Left Pain - part of body: Hip     Time: 4680-3212 PT Time Calculation (min) (ACUTE ONLY): 11 min  Charges:  $Therapeutic Exercise: 8-22 mins                    Danielle Dess, PTA 04/02/20, 12:39 PM

## 2020-04-02 NOTE — Progress Notes (Signed)
Patient discharged via ems to [facility] via Doctor, general practice .

## 2020-04-02 NOTE — TOC Progression Note (Signed)
Transition of Care [facility]) - Progression Note    Patient Details  Name: Christon Gallaway MRN: [id number] Date of Birth: [birth year]  [facility] of [facility] CM/SW Contact  Barrie Dunker, RN Phone Number: 04/02/2020, 10:05 AM  Clinical Narrative:   Sherron Monday with Leavy Cella the [employer] legal Guardian and let her know that the patient will be going to [facility] in [place] today, she agreed, She is emailing me a copy of the DSS legal guardianship as well as the Covid vaccine to place in the chart.        Expected Discharge Plan and Services           Expected Discharge Date: 04/02/20                                     Social Determinants of Health (SDOH) Interventions    Readmission Risk Interventions No flowsheet data found.

## 2020-04-02 NOTE — Care Management Important Message (Signed)
Important Message  Patient Details  Name: Kayla Fowler MRN: [id number] Date of Birth: [birth year]   Medicare Important Message Given:  No  Patient discharged prior to arrival to unit to deliver concurrent Medicare IM.     [name] 04/02/2020, 2:10 PM

## 2020-04-02 NOTE — Discharge Summary (Signed)
Physician Discharge Summary  Kayla Fowler ZOX:[id number] DOB: 01/25/1927 DOA: 03/26/2020  PCP: Theodosia Blender, MD  Admit date: 03/26/2020 Discharge date: 04/02/2020  Admitted From: ALF  Disposition:  SNF   Recommendations for Outpatient Follow-up and new medication changes:  1. Follow up with Dr. Gwinda Passe in 7 days.  2. Please follow up with left hip hematoma resolution before starting DVT prophylaxis. 3. Fall and aspiration precautions.    Home Health: na   Equipment/Devices: na    Discharge Condition: full CODE STATUS: stable   Diet recommendation:  Heart healthy     Diet recommendations: Dysphagia 1 (puree);Nectar-thick liquid Liquids provided via: Cup;Straw Medication Administration: Crushed with puree (for ease, safety of swallowing) Supervision: Patient able to self feed;Full supervision/cueing for compensatory strategies;Staff to assist with self feeding (hold cup) Compensations: Minimize environmental distractions;Slow rate;Small sips/bites;Lingual sweep for clearance of pocketing;Follow solids with liquid Postural Changes and/or Swallow Maneuvers: Seated upright 90 degrees;Upright 30-60 min after meal     Brief/Interim Summary: Patient admitted to the hospital with left displaced femoral neck fracture, sp arthroplasty. Now complicated with left hip hematoma with acute blood loss anemia.  86 year old female past medical history for dementia who presented after a unwitnessed mechanical fall. Post trauma she had significant left hip pain. Limited history due to cognitive impairment. On her initial physical examination blood pressure 146/81, heart rate 91, respiratory rate16, temperature 98.9, oxygen saturation 95%.Lungs clear to auscultation bilaterally, heart S1-S2, present rhythm, soft abdomen, no lower extremity edema, left leg was shortened. Sodium 139, potassium 3.9, chloride 101, bicarb 26, glucose 125, BUN 23, creatinine 0.81, white count 10.6, hemoglobin  12.2, hematocrit 37.1, platelets 165. SARS COVID-19 negative.  Urinalysis specific gravity 1.033, 6-10 red cells, 6-10 white cells.  Head and cervical spine no acute changes.  Chest radiograph no infiltrates.  EKG 79 bpm, normal axis, normal intervals, sinus rhythm with PVCs/PACs no significant ST segment T wave changes  Left hip films with transcervical left femoral neck fracture with marked varus angulation.  Patient received analgesics and underwent left hip bipolar arthroplasty on 8/23.  Patient very weak and deconditioned, noted to have acute on chronic anemia post-operative.   She developed a larger left thigh hematoma with acute blood loss anemia, required one unit PRBC transfusion  1.  Left femoral neck fracture.  Status post left hemiarthroplasty 8/23, complicated by a large left thigh hematoma. Postoperative, her pain remained controlled with analgesics, she was noted to have a drop in her hemoglobin, it dropped down to 7.0, four days after surgery.  CT of the left thigh showed a 6.7 x 3.2 x 21.3 cm partially organized hematoma.  Enoxaparin was discontinued, patient received 1 unit packed red blood cells with good toleration. Her discharge hemoglobin is 8.1 with hematocrit of 25.4.  Recommend repeat left thigh CT before resumption of DVT prophylaxis, likely with aspirin 325 mg twice daily.   Patient was seen by physical therapy/occupational therapy, recommendations for further therapy at the skilled nursing facility.  2.  Metabolic encephalopathy in the setting of dementia and depression/swallow dysfunction.  Patient had persistent confusion and disorientation, episodic agitation.  Patient received haloperidol and was started on quetiapine with good toleration. Speech therapy was consulted with recommendations for dysphagia 1 diet. Continue nutritional support, and aspiration precautions.  Patient will continue duloxetine and mirtazapine.  3.  Hypertension.  Antihypertensive  agents were held, her blood pressure remained well controlled.  4.  Osteoarthritis.  Continue physical therapy at the skilled nursing facility.  5.  Iron deficiency anemia.  Serum iron 12, TIBC 179, transferrin saturation 7, ferritin 156, transferrin 133. Patient received 1 dose of IV iron, follow-up iron panel as an outpatient.  6.  Severe malnutrition.  Patient will continue attritional supplements.  7.  Hypokalemia.  Patient received potassium supplements with good toleration, her kidney function remained stable. Discharge sodium 141, potassium 3.4, chloride 101, bicarb 30, glucose 96, BUN 19, creatinine 0.72.  Discharge Diagnoses:  Principal Problem:   Left displaced femoral neck fracture (HCC) Active Problems:   Dementia without behavioral disturbance (HCC)   Accidental fall   Preoperative clearance   Essential hypertension   Primary osteoarthritis involving multiple joints   Protein-calorie malnutrition, severe   Hematoma of left thigh    Discharge Instructions   Allergies as of 04/02/2020      Reactions   Ibuprofen Other (See Comments)   Reaction: unknown   Neomycin Other (See Comments)   Reaction: unknown   Percocet [oxycodone-acetaminophen] Other (See Comments)   Reaction: unknown   Triple Antibiotic [bacitracin-neomycin-polymyxin] Other (See Comments)   Reaction: unknown      Medication List    TAKE these medications   acetaminophen 500 MG tablet Commonly known as: TYLENOL Take 1 tablet (500 mg total) by mouth every 6 (six) hours as needed. What changed:   how much to take  when to take this  reasons to take this   Cholecalciferol 50 MCG (2000 UT) Tabs Take 2,000 Units by mouth daily.   diclofenac sodium 1 % Gel Commonly known as: VOLTAREN Apply 2 g topically 2 (two) times daily. Apply to right shoulder and neck   DULoxetine 20 MG capsule Commonly known as: CYMBALTA Take 40 mg by mouth daily.   feeding supplement (NEPRO CARB STEADY)  Liqd Take 237 mLs by mouth 2 (two) times daily between meals.   Halls Cough Drops 5.8 MG Lozg Generic drug: Menthol Use as directed 1 lozenge in the mouth or throat every 3 (three) hours as needed (sore throat).   HYDROcodone-acetaminophen 5-325 MG tablet Commonly known as: NORCO/VICODIN Take 1 tablet by mouth every 8 (eight) hours as needed for severe pain.   loperamide 2 MG tablet Commonly known as: IMODIUM A-D Take 2-4 mg by mouth 4 (four) times daily as needed for diarrhea or loose stools. Take 2 tablets (4 mg) when diarrhea starts, then 1 tablet (2 mg) for additional doses   melatonin 3 MG Tabs tablet Take 3 mg by mouth at bedtime.   mirtazapine 15 MG tablet Commonly known as: REMERON Take 7.5 mg by mouth at bedtime.   senna 8.6 MG Tabs tablet Commonly known as: SENOKOT Take 1 tablet by mouth at bedtime as needed for mild constipation.   Therems-M Tabs Take 1 tablet by mouth daily.   traZODone 100 MG tablet Commonly known as: DESYREL Take 100 mg by mouth at bedtime.       Contact information for follow-up providers    Theodosia Blender, MD Follow up in 1 week(s).   Specialty: Geriatric Medicine Contact information: [address] [place] [phone] [facility] [place] [address] [phone]            Contact information for after-discharge care    Destination    [facility] .   Service: Skilled Nursing Contact information: [address] [place] [address] [phone]                 Allergies  Allergen Reactions  . Ibuprofen Other (See  Comments)    Reaction: unknown  . Neomycin Other (See Comments)    Reaction: unknown  . Percocet [Oxycodone-Acetaminophen] Other (See Comments)    Reaction: unknown  . Triple Antibiotic [Bacitracin-Neomycin-Polymyxin] Other (See Comments)    Reaction: unknown    Consultations:  orhtopedics   Procedures/Studies: DG Chest 1 View  Result Date: 03/26/2020 CLINICAL  DATA:  86 year old female with fall and left knee pain. EXAM: CHEST  1 VIEW COMPARISON:  Chest radiograph dated 10/18/2019 and CT dated 10/18/2019 FINDINGS: No focal consolidation, pleural effusion, or pneumothorax. There is chronic interstitial coarsening and bronchitic changes. Mild cardiomegaly. Atherosclerotic calcification of the aorta. Osteopenia with degenerative changes of the spine. T11 compression fracture as seen previously. No acute osseous pathology. Left shoulder arthroplasty. IMPRESSION: 1. No acute cardiopulmonary process. 2. Mild cardiomegaly. Electronically Signed   By: Elgie CollardArash  Radparvar M.D.   On: 03/26/2020 03:46   CT Head Wo Contrast  Result Date: 03/26/2020 CLINICAL DATA:  Unwitnessed fall. EXAM: CT HEAD WITHOUT CONTRAST CT CERVICAL SPINE WITHOUT CONTRAST TECHNIQUE: Multidetector CT imaging of the head and cervical spine was performed following the standard protocol without intravenous contrast. Multiplanar CT image reconstructions of the cervical spine were also generated. COMPARISON:  CT head and cervical spine 05/13/2019 FINDINGS: CT HEAD FINDINGS Brain: Atrophy and white matter changes are stable. Remote infarct of the left parietal lobe is stable. Chronic ischemic changes are evident in the thalami bilaterally and extending into the brainstem. The ventricles are proportionate to the degree of atrophy. No significant extraaxial fluid collection is present. The brainstem and cerebellum are otherwise within normal limits. Vascular: Atherosclerotic calcifications within the cavernous internal carotid arteries and vertebral arteries at the dural margin bilaterally are stable. No hyperdense vessel is present. Skull: Calvarium is intact. No focal lytic or blastic lesions are present. No significant extracranial soft tissue lesion is present. Sinuses/Orbits: The paranasal sinuses and mastoid air cells are clear. Bilateral lens replacements are noted. Globes and orbits are otherwise  unremarkable. CT CERVICAL SPINE FINDINGS Alignment: Grade 1 degenerative anterolisthesis at C3-4, C4-5, C7-T1, and T1-2 is stable. Skull base and vertebrae: Degenerative changes are present C1-2. Craniocervical junction is otherwise normal. No acute abnormalities are present. Soft tissues and spinal canal: No prevertebral fluid or swelling. No visible canal hematoma. Disc levels: Right foraminal narrowing at C3-4 C4-5 and left foraminal narrowing at C5-6 is stable. No new or significantly progressive disc disease is present. Central canal stenosis is most severe at C5-6, stable Upper chest: Scarring at the right lung apex is stable. Thoracic inlet is within normal limits. IMPRESSION: 1. No acute trauma to the head or cervical spine. 2. Stable atrophy and white matter disease. 3. Stable multilevel degenerative changes of the cervical spine as described. Electronically Signed   By: Marin Robertshristopher  Mattern M.D.   On: 03/26/2020 04:34   CT Cervical Spine Wo Contrast  Result Date: 03/26/2020 CLINICAL DATA:  Unwitnessed fall. EXAM: CT HEAD WITHOUT CONTRAST CT CERVICAL SPINE WITHOUT CONTRAST TECHNIQUE: Multidetector CT imaging of the head and cervical spine was performed following the standard protocol without intravenous contrast. Multiplanar CT image reconstructions of the cervical spine were also generated. COMPARISON:  CT head and cervical spine 05/13/2019 FINDINGS: CT HEAD FINDINGS Brain: Atrophy and white matter changes are stable. Remote infarct of the left parietal lobe is stable. Chronic ischemic changes are evident in the thalami bilaterally and extending into the brainstem. The ventricles are proportionate to the degree of atrophy. No significant extraaxial fluid collection is present. The  brainstem and cerebellum are otherwise within normal limits. Vascular: Atherosclerotic calcifications within the cavernous internal carotid arteries and vertebral arteries at the dural margin bilaterally are stable. No  hyperdense vessel is present. Skull: Calvarium is intact. No focal lytic or blastic lesions are present. No significant extracranial soft tissue lesion is present. Sinuses/Orbits: The paranasal sinuses and mastoid air cells are clear. Bilateral lens replacements are noted. Globes and orbits are otherwise unremarkable. CT CERVICAL SPINE FINDINGS Alignment: Grade 1 degenerative anterolisthesis at C3-4, C4-5, C7-T1, and T1-2 is stable. Skull base and vertebrae: Degenerative changes are present C1-2. Craniocervical junction is otherwise normal. No acute abnormalities are present. Soft tissues and spinal canal: No prevertebral fluid or swelling. No visible canal hematoma. Disc levels: Right foraminal narrowing at C3-4 C4-5 and left foraminal narrowing at C5-6 is stable. No new or significantly progressive disc disease is present. Central canal stenosis is most severe at C5-6, stable Upper chest: Scarring at the right lung apex is stable. Thoracic inlet is within normal limits. IMPRESSION: 1. No acute trauma to the head or cervical spine. 2. Stable atrophy and white matter disease. 3. Stable multilevel degenerative changes of the cervical spine as described. Electronically Signed   By: Marin Roberts M.D.   On: 03/26/2020 04:34   CT FEMUR LEFT WO CONTRAST  Result Date: 03/31/2020 CLINICAL DATA:  Left thigh hematoma. Recent left hip hemiarthroplasty for fracture 5 days ago. EXAM: CT OF THE LOWER LEFT EXTREMITY WITHOUT CONTRAST TECHNIQUE: Multidetector CT imaging of the lower left extremity was performed according to the standard protocol. COMPARISON:  Left rib x-rays dated March 26, 2020. FINDINGS: Bones/Joint/Cartilage Interval left hip hemiarthroplasty. No evidence of hardware failure or loosening. No fracture or dislocation. Degenerative changes of the pubic symphysis and the knees. No joint effusion. Osteopenia. Ligaments Ligaments are suboptimally evaluated by CT. Muscles and Tendons Atrophy of the left  gluteus minimus and medius muscles. Small foci of postsurgical subcutaneous emphysema in the left iliacus muscle. Soft tissue Irregular 6.7 x 3.2 x 21.3 cm partially organized hyperdense collection overlying the superficial fascia extending from the mid iliac bone to tip of the femoral stem (series 6, image 49). No low-density fluid collection. No soft tissue mass. Large amount of stool in the rectum. IMPRESSION: 1. Irregular 6.7 x 3.2 x 21.3 cm partially organized hematoma overlying the superficial fascia of the left hip and upper thigh extending from the mid iliac bone to tip of the femoral stem. 2. Interval left hip hemiarthroplasty without evidence of hardware complication. No acute osseous abnormality. Electronically Signed   By: Obie Dredge M.D.   On: 03/31/2020 10:18   DG Hip Port Unilat With Pelvis 1V Left  Result Date: 03/26/2020 CLINICAL DATA:  Left hip arthroplasty EXAM: DG HIP (WITH OR WITHOUT PELVIS) 1V PORT LEFT COMPARISON:  03/26/2020 FINDINGS: Interval left hip arthroplasty. No acute hardware complication. Osteopenia. Remote right hip arthroplasty. IMPRESSION: Expected appearance after interval left hip arthroplasty. Electronically Signed   By: Jeronimo Greaves M.D.   On: 03/26/2020 19:45   DG Hip Unilat W or Wo Pelvis 2-3 Views Left  Result Date: 03/26/2020 CLINICAL DATA:  Fall, left hip pain EXAM: DG HIP (WITH OR WITHOUT PELVIS) 2-3V LEFT COMPARISON:  None. FINDINGS: Single view radiograph pelvis and two view radiograph left hip demonstrates a transcervical left femoral neck fracture with marked varus angulation of the distal fracture fragment. Femoral head appears seated within the left acetabulum but demonstrates marked internal rotation. Left hip joint space appears preserved. Pelvis  and sacrum appear intact. Right hip bipolar hemiarthroplasty has been performed. Degenerative changes are seen within the lumbar spine. IMPRESSION: Transcervical left femoral neck fracture with marked varus  angulation. Electronically Signed   By: Helyn Numbers MD   On: 03/26/2020 03:48   DG Femur Portable Min 2 Views Left  Result Date: 03/26/2020 CLINICAL DATA:  Fall, left hip pain EXAM: LEFT FEMUR PORTABLE 2 VIEWS COMPARISON:  None. FINDINGS: Two view radiograph of the left femur demonstrates a transcervical femoral neck fracture of the left hip with marked varus angulation of the distal fracture fragment. The femoral head is still seated within the left acetabulum and the left hip joint space appears preserved. No other fracture or dislocation identified. IMPRESSION: Transcervical left femoral neck fracture with marked varus angulation. Electronically Signed   By: Helyn Numbers MD   On: 03/26/2020 03:47      Procedures: left hip bipolar arthroplasty on 8/23.  Subjective: Patient very weak and deconditioned, no chest pain or dyspnea, no significant left hip pain.   Discharge Exam: Vitals:   04/02/20 0008 04/02/20 0720  BP: 121/60 (!) 124/59  Pulse: 90 79  Resp: 20 17  Temp: 98.7 F (37.1 C) 98.4 F (36.9 C)  SpO2: 92% (!) 89%   Vitals:   04/01/20 1603 04/01/20 1646 04/02/20 0008 04/02/20 0720  BP: 126/69  121/60 (!) 124/59  Pulse: 87  90 79  Resp: Temp: 98.3 F (36.8 C)  98.7 F (37.1 C) 98.4 F (36.9 C)  TempSrc: Oral  Oral   SpO2: (!) 89% 95% 92% (!) 89%  Weight:      Height:        General: Not in pain or dyspnea.  Neurology: Awake and alert, non focal. Able to follow commands and respond to simple questions.  E ENT: mild pallor, no icterus, oral mucosa moist Cardiovascular: No JVD. S1-S2 present, rhythmic, no gallops, rubs, or murmurs. No lower extremity edema. Pulmonary: positive breath sounds bilaterally, adequate air movement, no wheezing, rhonchi or rales. Gastrointestinal. Abdomen soft and non tender Skin. No rashes Musculoskeletal: left hip hematoma clinically improving with reduction in ecchymosis and tenderness.    The results of significant  diagnostics from this hospitalization (including imaging, microbiology, ancillary and laboratory) are listed below for reference.     Microbiology: Recent Results (from the past 240 hour(s))  SARS Coronavirus 2 by RT PCR (hospital order, performed in [facility] hospital lab) Nasopharyngeal Nasopharyngeal Swab     Status: None   Collection Time: 03/26/20  6:18 AM   Specimen: Nasopharyngeal Swab  Result Value Ref Range Status   SARS Coronavirus 2 NEGATIVE NEGATIVE Final    Comment: (NOTE) SARS-CoV-2 target nucleic acids are NOT DETECTED.  The SARS-CoV-2 RNA is generally detectable in upper and lower respiratory specimens during the acute phase of infection. The lowest concentration of SARS-CoV-2 viral copies this assay can detect is 250 copies / mL. A negative result does not preclude SARS-CoV-2 infection and should not be used as the sole basis for treatment or other patient management decisions.  A negative result may occur with improper specimen collection / handling, submission of specimen other than nasopharyngeal swab, presence of viral mutation(s) within the areas targeted by this assay, and inadequate number of viral copies (<250 copies / mL). A negative result must be combined with clinical observations, patient history, and epidemiological information.  Fact Sheet for Patients:   BoilerBrush.com.cy  Fact Sheet for Healthcare Providers: https://pope.com/  This test is not yet approved or  cleared by the [place] and has been authorized for detection and/or diagnosis of SARS-CoV-2 by FDA under an Emergency Use Authorization (EUA).  This EUA will remain in effect (meaning this test can be used) for the duration of the COVID-19 declaration under Section 564(b)(1) of the Act, 21 U.S.C. section 360bbb-3(b)(1), unless the authorization is terminated or revoked sooner.  Performed at [facility], [address], [place], [place] [address]   Surgical pcr screen     Status: Abnormal   Collection Time: 03/26/20  8:48 AM   Specimen: Nasal Mucosa; Nasal Swab  Result Value Ref Range Status   MRSA, PCR POSITIVE (A) NEGATIVE Final    Comment: RESULT CALLED TO, READ BACK BY AND VERIFIED WITH: JUN GUIRAL ON 03/26/20 AT 1014 QSD    Staphylococcus aureus POSITIVE (A) NEGATIVE Final    Comment: (NOTE) The Xpert SA Assay (FDA approved for NASAL specimens in patients 55 years of age and older), is one component of a comprehensive surveillance program. It is not intended to diagnose infection nor to guide or monitor treatment. Performed at [facility], [address] [place], [place] [address]   CULTURE, BLOOD (ROUTINE X 2) w Reflex to ID Panel     Status: None   Collection Time: 03/27/20  8:35 AM   Specimen: BLOOD  Result Value Ref Range Status   Specimen Description BLOOD RIGHT [facility]  Final   Special Requests   Final    BOTTLES DRAWN AEROBIC AND ANAEROBIC Blood Culture adequate volume   Culture   Final    NO GROWTH 5 DAYS Performed at [facility], [address] [place], [place] [address]    Report Status 04/01/2020 FINAL  Final  CULTURE, BLOOD (ROUTINE X 2) w Reflex to ID Panel     Status: None   Collection Time: 03/27/20  8:37 AM   Specimen: BLOOD  Result Value Ref Range Status   Specimen Description BLOOD RIGHT WRIST  Final   Special Requests   Final    BOTTLES DRAWN AEROBIC AND ANAEROBIC Blood Culture results may not be optimal due to an excessive volume of blood received in culture bottles   Culture   Final    NO GROWTH 5 DAYS Performed at [facility], [address] [place], [place] [address]    Report Status 04/01/2020 FINAL  Final  SARS Coronavirus 2 by RT PCR (hospital order, performed in [facility] hospital lab) Nasopharyngeal Nasopharyngeal Swab     Status: None   Collection Time: 04/01/20  4:10 PM   Specimen: Nasopharyngeal Swab  Result Value  Ref Range Status   SARS Coronavirus 2 NEGATIVE NEGATIVE Final    Comment: (NOTE) SARS-CoV-2 target nucleic acids are NOT DETECTED.  The SARS-CoV-2 RNA is generally detectable in upper and lower respiratory specimens during the acute phase of infection. The lowest concentration of SARS-CoV-2 viral copies this assay can detect is 250 copies / mL. A negative result does not preclude SARS-CoV-2 infection and should not be used as the sole basis for treatment or other patient management decisions.  A negative result may occur with improper specimen collection / handling, submission of specimen other than nasopharyngeal swab, presence of viral mutation(s) within the areas targeted by this assay, and inadequate number of viral copies (<250 copies / mL). A negative result must be combined with clinical observations, patient history, and epidemiological information.  Fact Sheet for Patients:  BoilerBrush.com.cy  Fact Sheet for Healthcare Providers: https://pope.com/  This test is not yet approved or  cleared by the [place] FDA and has been authorized for detection and/or diagnosis of SARS-CoV-2 by FDA under an Emergency Use Authorization (EUA).  This EUA will remain in effect (meaning this test can be used) for the duration of the COVID-19 declaration under Section 564(b)(1) of the Act, 21 U.S.C. section 360bbb-3(b)(1), unless the authorization is terminated or revoked sooner.  Performed at [facility], [address] [place], [place] [address]      Labs: BNP (last 3 results) No results for input(s): BNP in the last 8760 hours. Basic Metabolic Panel: Recent Labs  Lab 03/27/20 0835 03/28/20 0614 03/30/20 0637 04/01/20 0640 04/02/20 0436  NA 134* 136 139 140 141  K 3.4* 4.1 3.8 3.2* 3.4*  CL 97* 100 101 99 101  CO2 27 27 31  33* 33*  GLUCOSE 112* 115* 151* 102* 96  BUN 17 23 12 10 19   CREATININE 0.72 0.92 0.61  0.57 0.72  CALCIUM 8.3* 8.0* 8.7* 8.5* 8.2*   Liver Function Tests: No results for input(s): AST, ALT, ALKPHOS, BILITOT, PROT, ALBUMIN in the last 168 hours. No results for input(s): LIPASE, AMYLASE in the last 168 hours. No results for input(s): AMMONIA in the last 168 hours. CBC: Recent Labs  Lab 03/28/20 0614 03/28/20 0614 03/29/20 1035 03/30/20 0637 03/31/20 0556 04/01/20 0640 04/02/20 0436  WBC 13.2*  --  11.7* 10.4  --  8.8 8.6  NEUTROABS  --   --   --  8.2*  --  6.1 5.4  HGB 8.3*   < > 7.8* 7.0* 8.1* 8.9* 8.1*  HCT 25.1*   < > 24.2* 21.3* 23.9* 26.6* 25.4*  MCV 97.7  --  98.4 96.4  --  93.7 97.7  PLT 186  --  216 257  --  361 390   < > = values in this interval not displayed.   Cardiac Enzymes: No results for input(s): CKTOTAL, CKMB, CKMBINDEX, TROPONINI in the last 168 hours. BNP: Invalid input(s): POCBNP CBG: No results for input(s): GLUCAP in the last 168 hours. D-Dimer No results for input(s): DDIMER in the last 72 hours. Hgb A1c No results for input(s): HGBA1C in the last 72 hours. Lipid Profile No results for input(s): CHOL, HDL, LDLCALC, TRIG, CHOLHDL, LDLDIRECT in the last 72 hours. Thyroid function studies No results for input(s): TSH, T4TOTAL, T3FREE, THYROIDAB in the last 72 hours.  Invalid input(s): FREET3 Anemia work up No results for input(s): VITAMINB12, FOLATE, FERRITIN, TIBC, IRON, RETICCTPCT in the last 72 hours. Urinalysis    Component Value Date/Time   COLORURINE AMBER (A) 03/27/2020 0443   APPEARANCEUR HAZY (A) 03/27/2020 0443   LABSPEC 1.033 (H) 03/27/2020 0443   PHURINE 5.0 03/27/2020 0443   GLUCOSEU NEGATIVE 03/27/2020 0443   HGBUR NEGATIVE 03/27/2020 0443   BILIRUBINUR NEGATIVE 03/27/2020 0443   KETONESUR 20 (A) 03/27/2020 0443   PROTEINUR 30 (A) 03/27/2020 0443   NITRITE NEGATIVE 03/27/2020 0443   LEUKOCYTESUR NEGATIVE 03/27/2020 0443   Sepsis Labs Invalid input(s): PROCALCITONIN,  WBC,  LACTICIDVEN Microbiology Recent  Results (from the past 240 hour(s))  SARS Coronavirus 2 by RT PCR (hospital order, performed in [facility] hospital lab) Nasopharyngeal Nasopharyngeal Swab     Status: None   Collection Time: 03/26/20  6:18 AM   Specimen: Nasopharyngeal Swab  Result Value Ref Range Status   SARS Coronavirus 2 NEGATIVE NEGATIVE Final    Comment: (  NOTE) SARS-CoV-2 target nucleic acids are NOT DETECTED.  The SARS-CoV-2 RNA is generally detectable in upper and lower respiratory specimens during the acute phase of infection. The lowest concentration of SARS-CoV-2 viral copies this assay can detect is 250 copies / mL. A negative result does not preclude SARS-CoV-2 infection and should not be used as the sole basis for treatment or other patient management decisions.  A negative result may occur with improper specimen collection / handling, submission of specimen other than nasopharyngeal swab, presence of viral mutation(s) within the areas targeted by this assay, and inadequate number of viral copies (<250 copies / mL). A negative result must be combined with clinical observations, patient history, and epidemiological information.  Fact Sheet for Patients:   BoilerBrush.com.cy  Fact Sheet for Healthcare Providers: https://pope.com/  This test is not yet approved or  cleared by the [place] FDA and has been authorized for detection and/or diagnosis of SARS-CoV-2 by FDA under an Emergency Use Authorization (EUA).  This EUA will remain in effect (meaning this test can be used) for the duration of the COVID-19 declaration under Section 564(b)(1) of the Act, 21 U.S.C. section 360bbb-3(b)(1), unless the authorization is terminated or revoked sooner.  Performed at [facility], [address], [place], [place] [address]   Surgical pcr screen     Status: Abnormal   Collection Time: 03/26/20  8:48 AM   Specimen: Nasal Mucosa; Nasal Swab  Result  Value Ref Range Status   MRSA, PCR POSITIVE (A) NEGATIVE Final    Comment: RESULT CALLED TO, READ BACK BY AND VERIFIED WITH: JUN GUIRAL ON 03/26/20 AT 1014 QSD    Staphylococcus aureus POSITIVE (A) NEGATIVE Final    Comment: (NOTE) The Xpert SA Assay (FDA approved for NASAL specimens in patients 68 years of age and older), is one component of a comprehensive surveillance program. It is not intended to diagnose infection nor to guide or monitor treatment. Performed at [facility], [address] [place], [place] [address]   CULTURE, BLOOD (ROUTINE X 2) w Reflex to ID Panel     Status: None   Collection Time: 03/27/20  8:35 AM   Specimen: BLOOD  Result Value Ref Range Status   Specimen Description BLOOD RIGHT [facility]  Final   Special Requests   Final    BOTTLES DRAWN AEROBIC AND ANAEROBIC Blood Culture adequate volume   Culture   Final    NO GROWTH 5 DAYS Performed at [facility], [address] [place], [place] [address]    Report Status 04/01/2020 FINAL  Final  CULTURE, BLOOD (ROUTINE X 2) w Reflex to ID Panel     Status: None   Collection Time: 03/27/20  8:37 AM   Specimen: BLOOD  Result Value Ref Range Status   Specimen Description BLOOD RIGHT WRIST  Final   Special Requests   Final    BOTTLES DRAWN AEROBIC AND ANAEROBIC Blood Culture results may not be optimal due to an excessive volume of blood received in culture bottles   Culture   Final    NO GROWTH 5 DAYS Performed at [facility], [address], [place], [place] [address]    Report Status 04/01/2020 FINAL  Final  SARS Coronavirus 2 by RT PCR (hospital order, performed in [facility] hospital lab) Nasopharyngeal Nasopharyngeal Swab     Status: None   Collection Time: 04/01/20  4:10 PM   Specimen: Nasopharyngeal Swab  Result Value Ref Range Status   SARS  Coronavirus 2 NEGATIVE NEGATIVE Final    Comment: (NOTE) SARS-CoV-2 target nucleic acids are NOT DETECTED.  The SARS-CoV-2 RNA is  generally detectable in upper and lower respiratory specimens during the acute phase of infection. The lowest concentration of SARS-CoV-2 viral copies this assay can detect is 250 copies / mL. A negative result does not preclude SARS-CoV-2 infection and should not be used as the sole basis for treatment or other patient management decisions.  A negative result may occur with improper specimen collection / handling, submission of specimen other than nasopharyngeal swab, presence of viral mutation(s) within the areas targeted by this assay, and inadequate number of viral copies (<250 copies / mL). A negative result must be combined with clinical observations, patient history, and epidemiological information.  Fact Sheet for Patients:   BoilerBrush.com.cy  Fact Sheet for Healthcare Providers: https://pope.com/  This test is not yet approved or  cleared by the [place] FDA and has been authorized for detection and/or diagnosis of SARS-CoV-2 by FDA under an Emergency Use Authorization (EUA).  This EUA will remain in effect (meaning this test can be used) for the duration of the COVID-19 declaration under Section 564(b)(1) of the Act, 21 U.S.C. section 360bbb-3(b)(1), unless the authorization is terminated or revoked sooner.  Performed at [facility], [address] [place], [place] [address]      Time coordinating discharge: 45 minutes  SIGNED:   Coralie Keens, MD  [facility] 04/02/2020, 8:26 AM

## 2020-04-02 NOTE — TOC Progression Note (Signed)
Transition of Care [facility]) - Progression Note    Patient Details  Name: Kayla Fowler MRN: [id number] Date of Birth: Apr 29, 1927  Transition of Care [facility]) CM/SW Contact  Barrie Dunker, RN Phone Number: 04/02/2020, 11:53 AM  Clinical Narrative:   Called EMS to arrange transport to [facility]          Expected Discharge Plan and Services           Expected Discharge Date: 04/02/20                                     Social Determinants of Health (SDOH) Interventions    Readmission Risk Interventions No flowsheet data found.

## 2020-04-02 NOTE — Progress Notes (Signed)
This Clinical research associate attempt to call report to [facility] x 2. The first time " Sue Lush" hung up on me before transferring the call. The second time Sue Lush transferred me to a number in which noone answered and there was no VM set up

## 2020-06-24 IMAGING — CT CT CERVICAL SPINE W/O CM
2 series · 14 of 20 positions shown, 17 images · non-contrast
Comparison: None.

CLINICAL DATA: Fall

EXAM:
CT HEAD WITHOUT CONTRAST
CT MAXILLOFACIAL WITHOUT CONTRAST
CT CERVICAL SPINE WITHOUT CONTRAST
TECHNIQUE: Multidetector CT imaging of the head, cervical spine, and
maxillofacial structures were performed using the standard protocol
without intravenous contrast. Multiplanar CT image reconstructions
of the cervical spine and maxillofacial structures were also
generated.

[Series 7: c spine soft · axial · 0.31mm/px · z∈[-260,-124]mm · 11 of 82 slices shown, 14 images]
[im 7/82  soft-tissue]
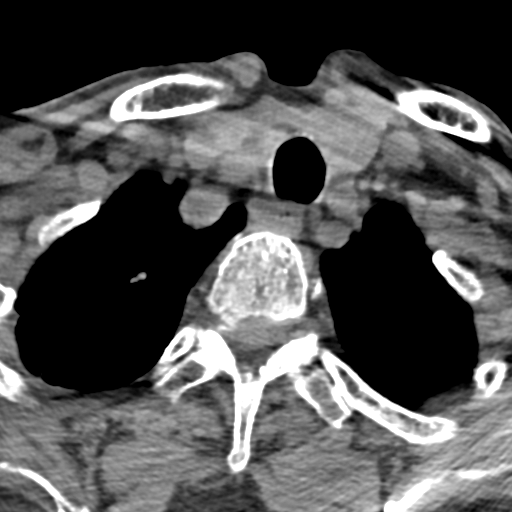
[im 7/82  bone]
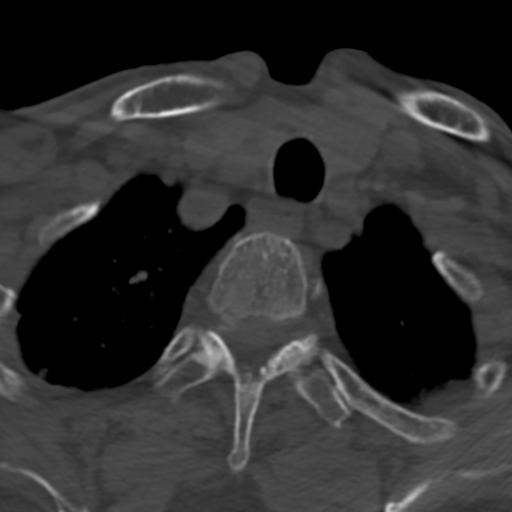
[im 13/82  bone]
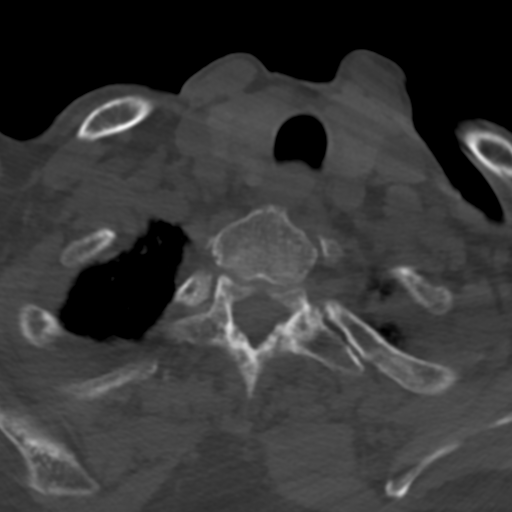
[im 19/82  bone]
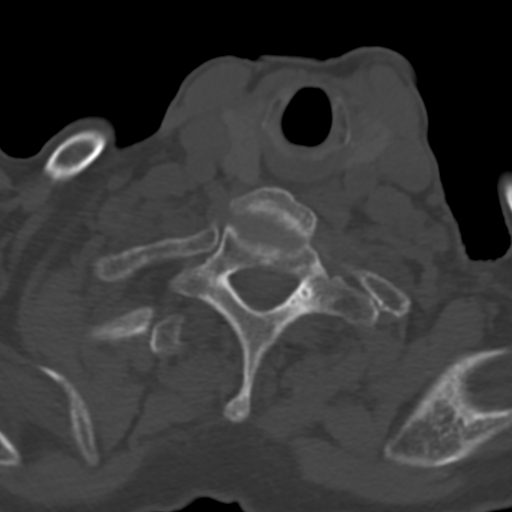
[im 25/82  bone]
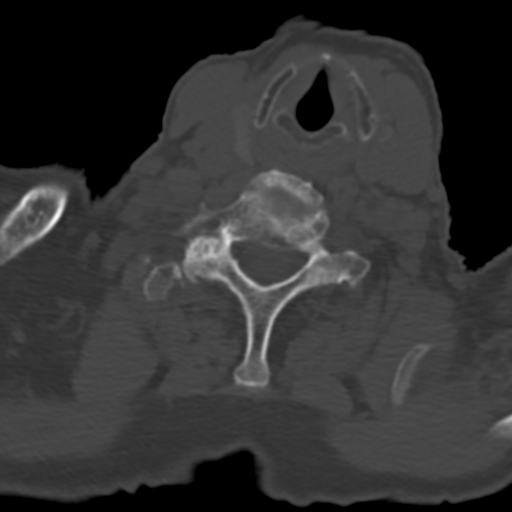
[im 32/82  soft-tissue]
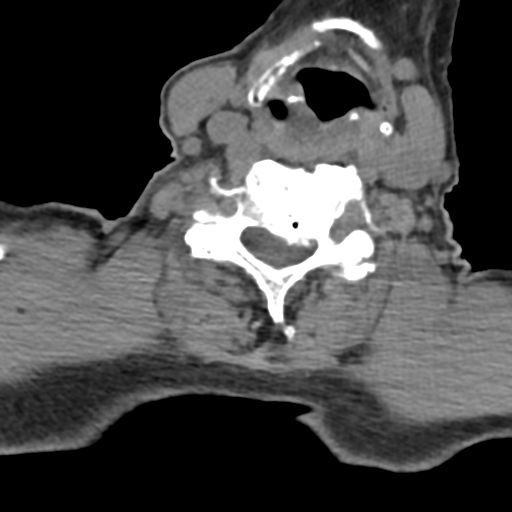
[im 32/82  bone]
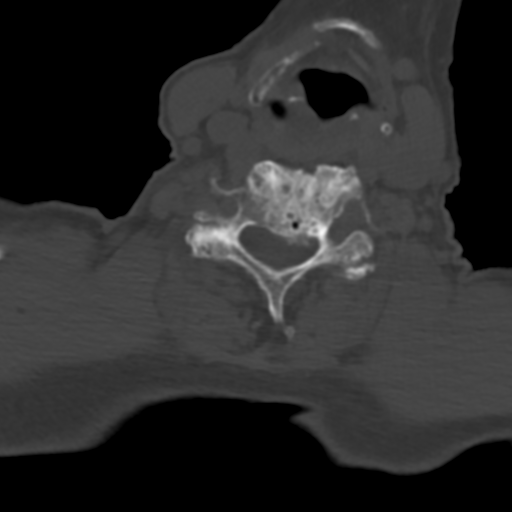
[im 44/82  bone]
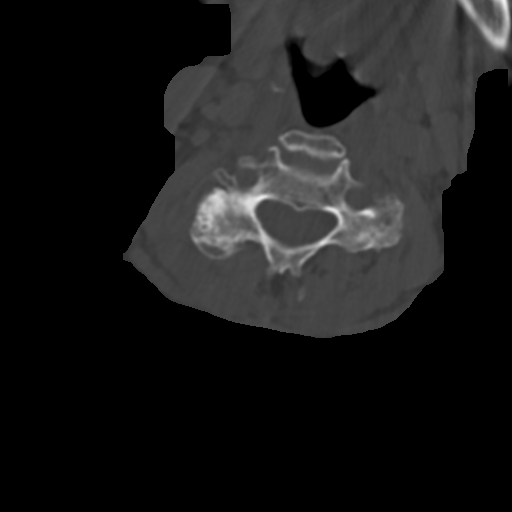
[im 50/82  bone]
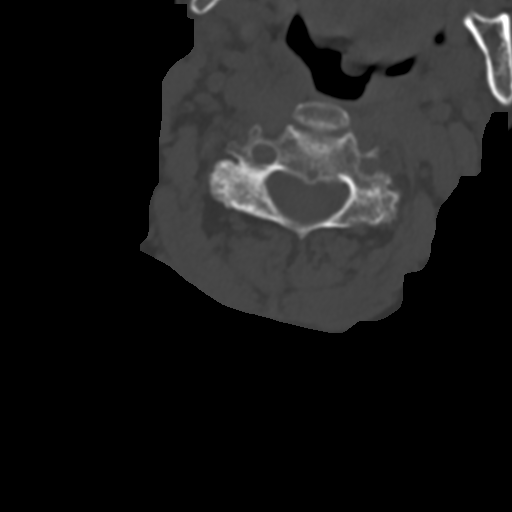
[im 57/82  bone]
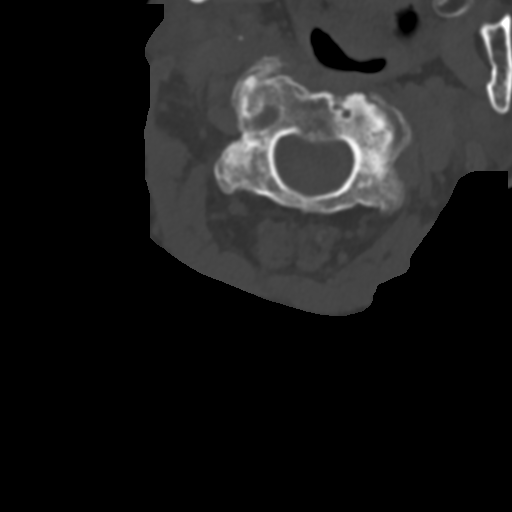
[im 63/82  soft-tissue]
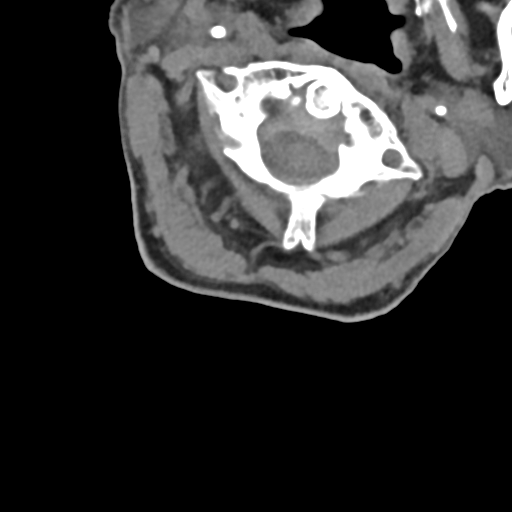
[im 63/82  bone]
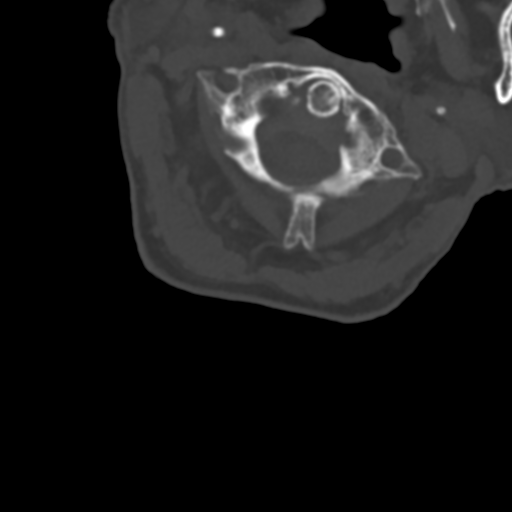
[im 69/82  bone]
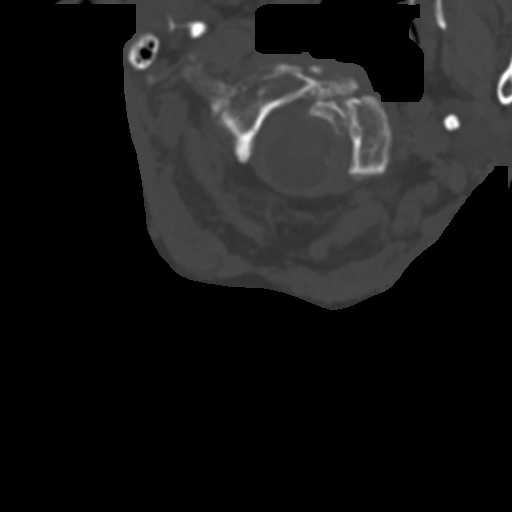
[im 75/82  bone]
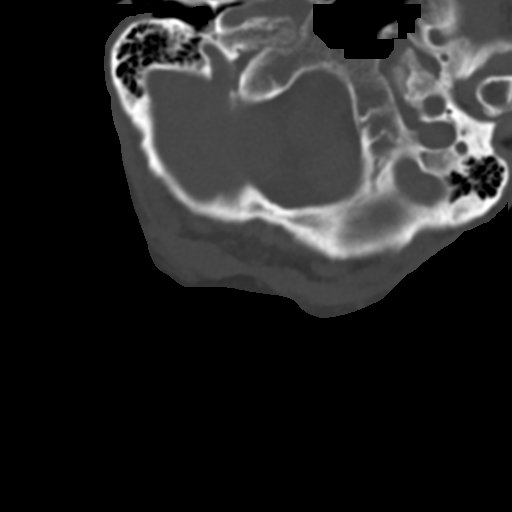

[Series 9: coronal bone · coronal · 0.26mm/px · 3 of 50 slices shown]
[im 10/50  bone]
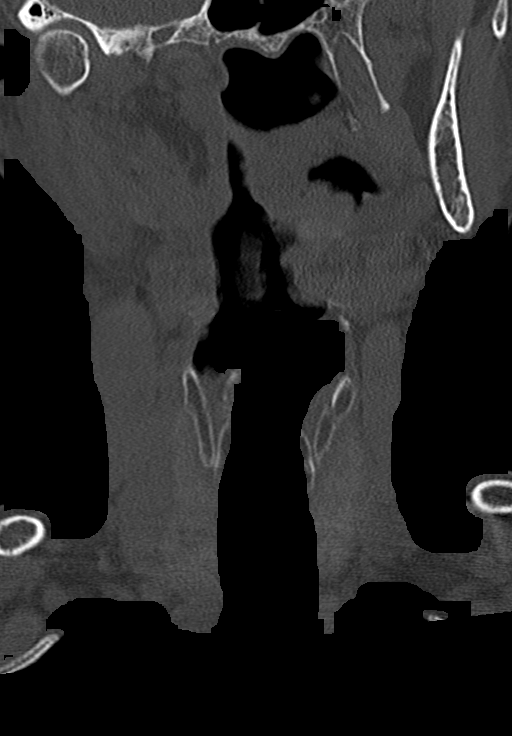
[im 20/50  bone]
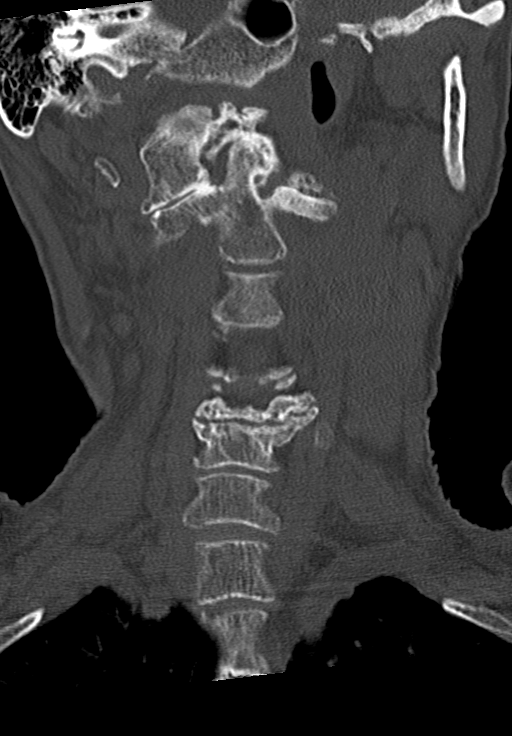
[im 30/50  bone]
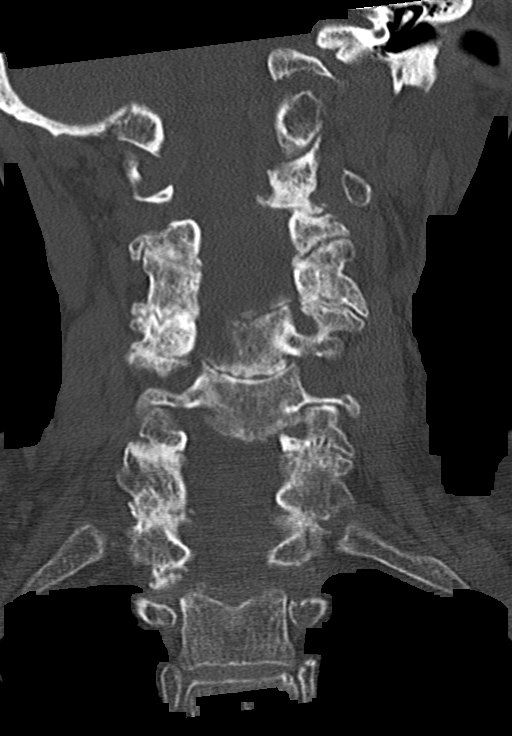

[14 of 20 positions shown; findings below may reference images not displayed]

FINDINGS: CT HEAD FINDINGS

Brain: There is no mass, hemorrhage or extra-axial collection. The
size and configuration of the ventricles and extra-axial CSF spaces
are normal. The brain parenchyma is normal, without evidence of
acute or chronic infarction.

Vascular: No abnormal hyperdensity of the major intracranial
arteries or dural venous sinuses. No intracranial atherosclerosis.

Skull: Small right frontal scalp hematoma.  No skull fracture.

CT MAXILLOFACIAL FINDINGS

Osseous:

--Complex facial fracture types: No LeFort, zygomaticomaxillary
complex or nasoorbitoethmoidal fracture.

--Simple fracture types: Mildly leftward lead displaced fracture of
the nasal bones.

--Mandible: No fracture or dislocation.

Orbits: The globes are intact. Normal appearance of the intra- and
extraconal fat. Symmetric extraocular muscles and optic nerves.

Sinuses: No fluid levels or advanced mucosal thickening.

Soft tissues: Normal visualized extracranial soft tissues.

CT CERVICAL SPINE FINDINGS

Alignment: There is reversal of normal cervical lordosis with grade
1 anterolisthesis at C2-3, C3-4 and C4-5 with grade 1 retrolisthesis
at C5-6. These findings are likely due to facet arthrosis.

Skull base and vertebrae: No acute fracture.

Soft tissues and spinal canal: No prevertebral fluid or swelling. No
visible canal hematoma.

Disc levels: There is severe left foraminal stenosis at C5-6.
Moderate spinal canal stenosis also at C5-6 level. There is
moderate-to-severe facet arthrosis throughout the cervical spine.

Upper chest: Multiple nodular opacities in the right lung apex,
measuring up to 5 mm.

Other: Normal visualized paraspinal cervical soft tissues.
IMPRESSION: 1. No acute intracranial abnormality.
2. Small right frontal scalp hematoma without skull fracture.
3. Mildly leftward displaced nasal bone fracture.
4. No acute fracture of the cervical spine.
5. C5-6 Moderate spinal canal and severe left foraminal stenosis.
6. Multilevel cervical spondylolisthesis due facet arthrosis.
7. Multiple small right apical pulmonary nodules, measuring up to 5
mm. No follow-up needed if patient is low-risk (and has no known or
suspected primary neoplasm). Non-contrast chest CT can be considered
in 12 months if patient is high-risk. This recommendation follows
the consensus statement: Guidelines for Management of Incidental
Pulmonary Nodules Detected on CT Images: From the [HOSPITAL]

## 2020-06-24 IMAGING — CT CT MAXILLOFACIAL W/O CM
3 of 7 series · 14 of 47 positions shown, 17 images · non-contrast
Comparison: None.

CLINICAL DATA: Fall

EXAM:
CT HEAD WITHOUT CONTRAST
CT MAXILLOFACIAL WITHOUT CONTRAST
CT CERVICAL SPINE WITHOUT CONTRAST
TECHNIQUE: Multidetector CT imaging of the head, cervical spine, and
maxillofacial structures were performed using the standard protocol
without intravenous contrast. Multiplanar CT image reconstructions
of the cervical spine and maxillofacial structures were also
generated.

[Series 15: st thins · axial · 0.33mm/px · z∈[-225,-92]mm · 11 of 307 slices shown, 14 images]
[im 27/307  brain]
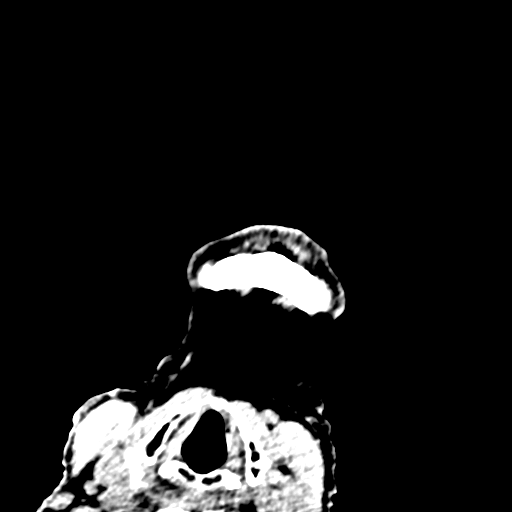
[im 27/307  bone]
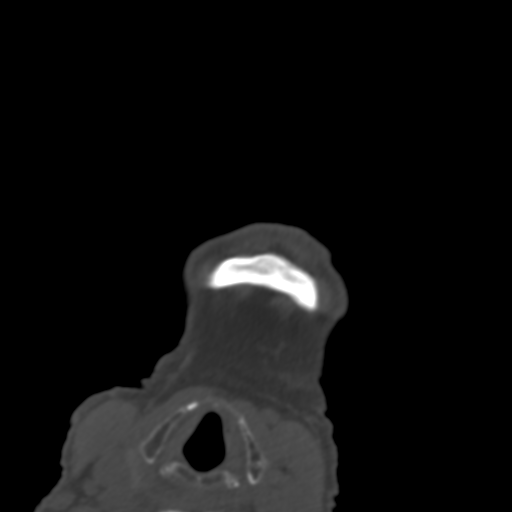
[im 54/307  bone]
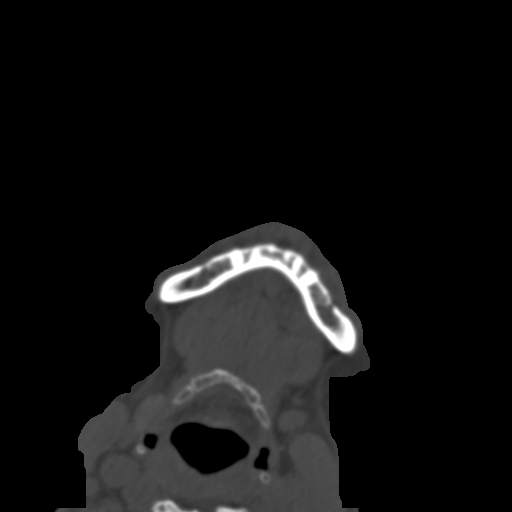
[im 80/307  bone]
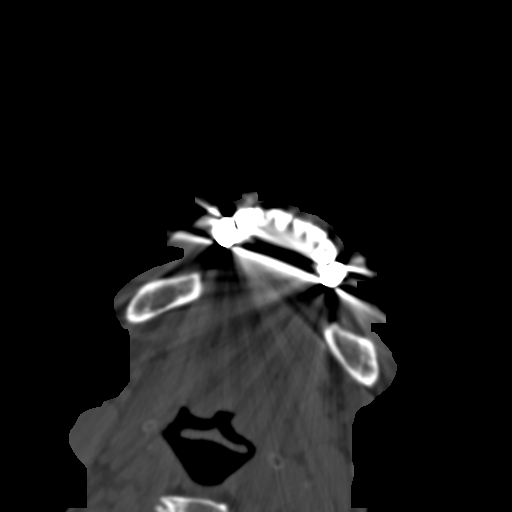
[im 107/307  bone]
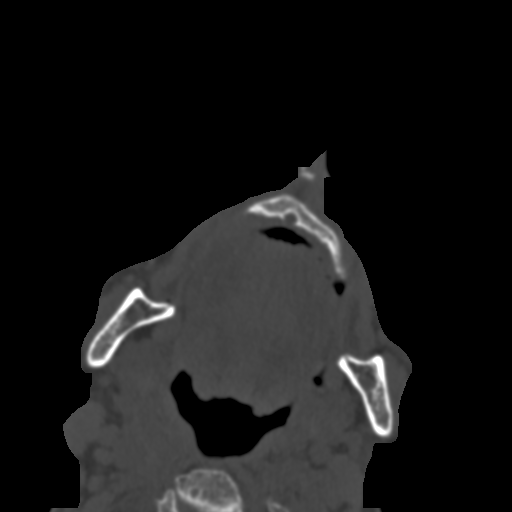
[im 134/307  brain]
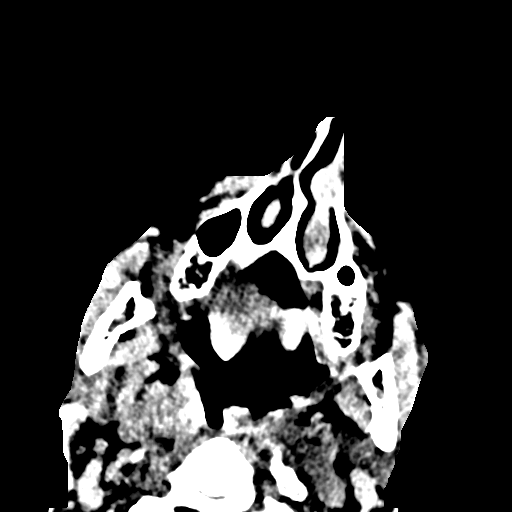
[im 134/307  bone]
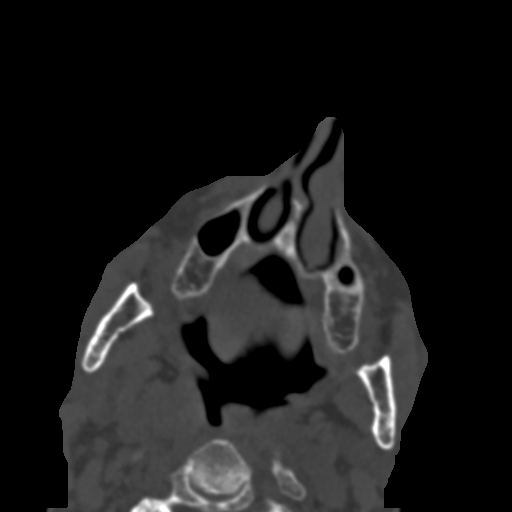
[im 160/307  bone]
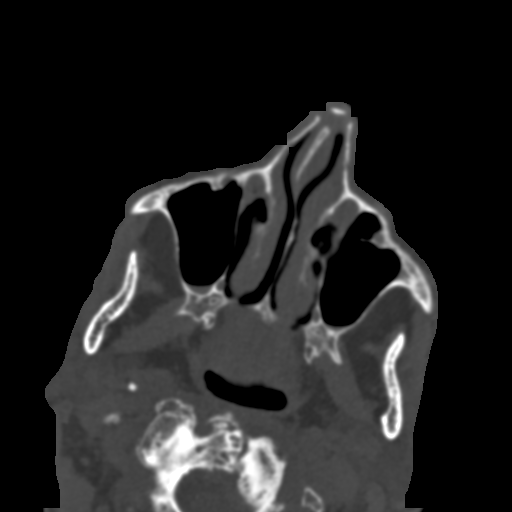
[im 187/307  bone]
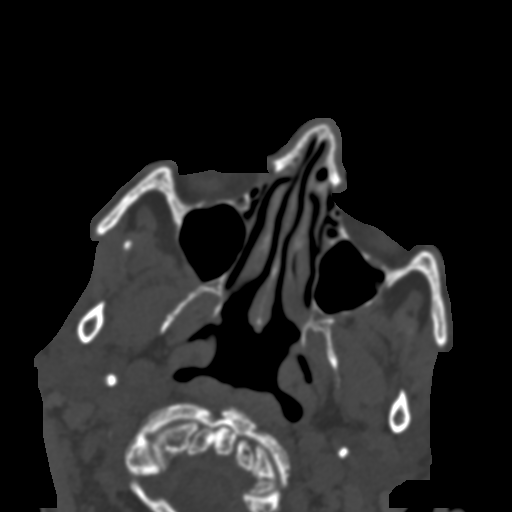
[im 213/307  bone]
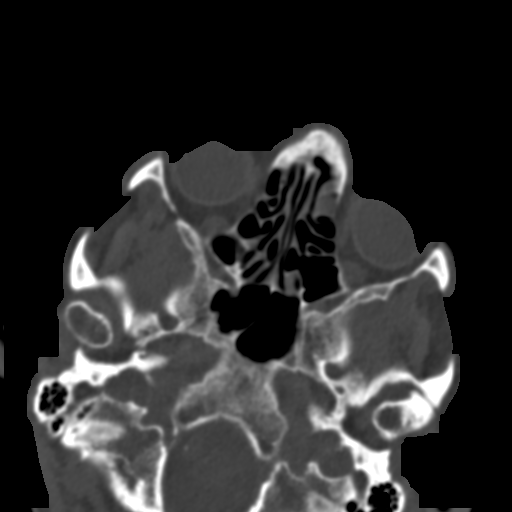
[im 240/307  brain]
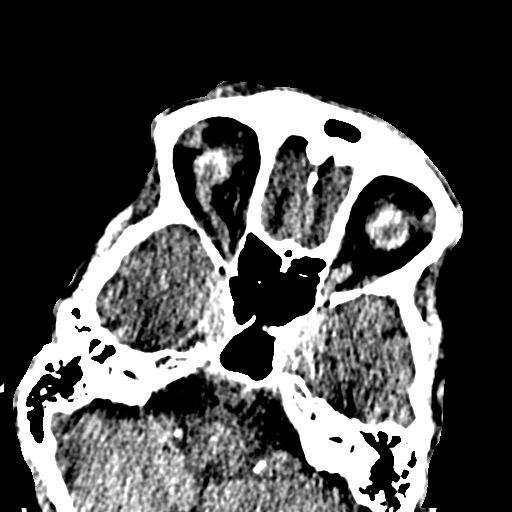
[im 240/307  bone]
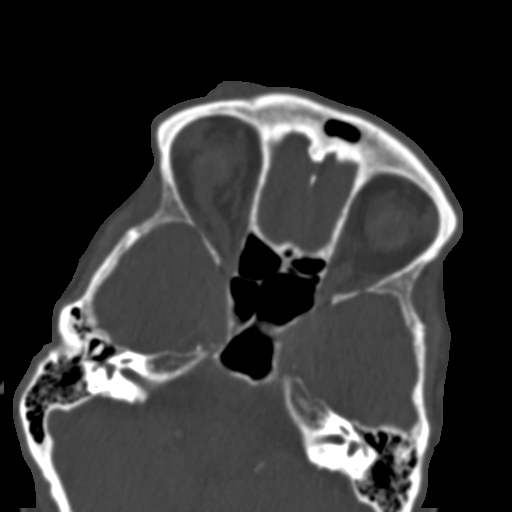
[im 267/307  bone]
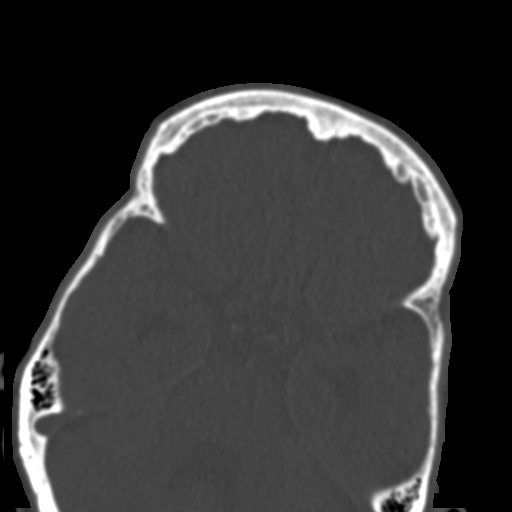
[im 293/307  bone]
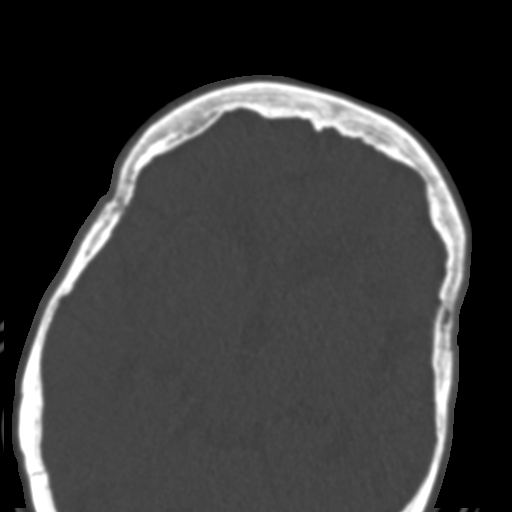

[Series 20: sagittal bone · sagittal · 0.29mm/px · 1 of 83 slices shown]
[im 42/83  bone]
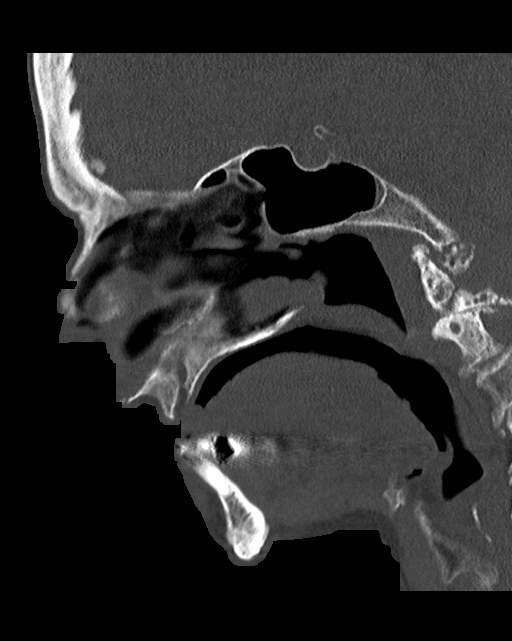

[Series 21: coronal bone..... · coronal · 0.35mm/px · 2 of 73 slices shown]
[im 25/73  bone]
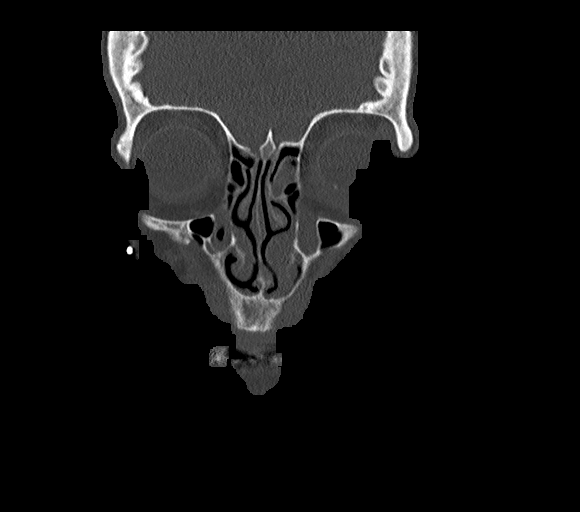
[im 49/73  bone]
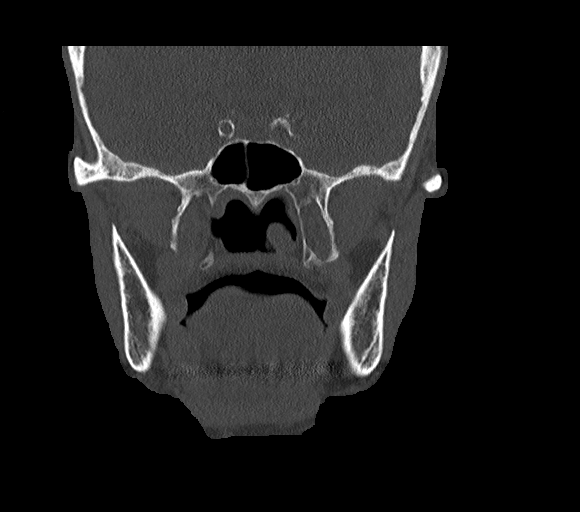

[14 of 47 positions shown; findings below may reference images not displayed]

FINDINGS: CT HEAD FINDINGS

Brain: There is no mass, hemorrhage or extra-axial collection. The
size and configuration of the ventricles and extra-axial CSF spaces
are normal. The brain parenchyma is normal, without evidence of
acute or chronic infarction.

Vascular: No abnormal hyperdensity of the major intracranial
arteries or dural venous sinuses. No intracranial atherosclerosis.

Skull: Small right frontal scalp hematoma.  No skull fracture.

CT MAXILLOFACIAL FINDINGS

Osseous:

--Complex facial fracture types: No LeFort, zygomaticomaxillary
complex or nasoorbitoethmoidal fracture.

--Simple fracture types: Mildly leftward lead displaced fracture of
the nasal bones.

--Mandible: No fracture or dislocation.

Orbits: The globes are intact. Normal appearance of the intra- and
extraconal fat. Symmetric extraocular muscles and optic nerves.

Sinuses: No fluid levels or advanced mucosal thickening.

Soft tissues: Normal visualized extracranial soft tissues.

CT CERVICAL SPINE FINDINGS

Alignment: There is reversal of normal cervical lordosis with grade
1 anterolisthesis at C2-3, C3-4 and C4-5 with grade 1 retrolisthesis
at C5-6. These findings are likely due to facet arthrosis.

Skull base and vertebrae: No acute fracture.

Soft tissues and spinal canal: No prevertebral fluid or swelling. No
visible canal hematoma.

Disc levels: There is severe left foraminal stenosis at C5-6.
Moderate spinal canal stenosis also at C5-6 level. There is
moderate-to-severe facet arthrosis throughout the cervical spine.

Upper chest: Multiple nodular opacities in the right lung apex,
measuring up to 5 mm.

Other: Normal visualized paraspinal cervical soft tissues.
IMPRESSION: 1. No acute intracranial abnormality.
2. Small right frontal scalp hematoma without skull fracture.
3. Mildly leftward displaced nasal bone fracture.
4. No acute fracture of the cervical spine.
5. C5-6 Moderate spinal canal and severe left foraminal stenosis.
6. Multilevel cervical spondylolisthesis due facet arthrosis.
7. Multiple small right apical pulmonary nodules, measuring up to 5
mm. No follow-up needed if patient is low-risk (and has no known or
suspected primary neoplasm). Non-contrast chest CT can be considered
in 12 months if patient is high-risk. This recommendation follows
the consensus statement: Guidelines for Management of Incidental
Pulmonary Nodules Detected on CT Images: From the [HOSPITAL]

## 2020-12-10 ENCOUNTER — Emergency Department (HOSPITAL_COMMUNITY)

## 2020-12-10 ENCOUNTER — Emergency Department (HOSPITAL_COMMUNITY)
Admission: EM | Admit: 2020-12-10 | Discharge: 2020-12-10 | Disposition: A | Discharge status: Home / Self Care | Attending: Emergency Medicine | Admitting: Emergency Medicine

## 2020-12-10 DIAGNOSIS — S0101XA Laceration without foreign body of scalp, initial encounter: Secondary | ICD-10-CM | POA: Diagnosis not present

## 2020-12-10 DIAGNOSIS — I10 Essential (primary) hypertension: Secondary | ICD-10-CM | POA: Diagnosis not present

## 2020-12-10 DIAGNOSIS — W19XXXA Unspecified fall, initial encounter: Secondary | ICD-10-CM | POA: Insufficient documentation

## 2020-12-10 DIAGNOSIS — F0390 Unspecified dementia without behavioral disturbance: Secondary | ICD-10-CM | POA: Diagnosis not present

## 2020-12-10 DIAGNOSIS — S0990XA Unspecified injury of head, initial encounter: Secondary | ICD-10-CM | POA: Diagnosis present

## 2020-12-10 DIAGNOSIS — Z23 Encounter for immunization: Secondary | ICD-10-CM | POA: Insufficient documentation

## 2020-12-10 DIAGNOSIS — Z96642 Presence of left artificial hip joint: Secondary | ICD-10-CM | POA: Insufficient documentation

## 2020-12-10 MED ORDER — TETANUS-DIPHTH-ACELL PERTUSSIS 5-2.5-18.5 LF-MCG/0.5 IM SUSY
0.5000 mL | PREFILLED_SYRINGE | Freq: Once | INTRAMUSCULAR | Status: AC
  Administered 2020-12-10: 0.5 mL via INTRAMUSCULAR
  Filled 2020-12-10: qty 0.5

## 2020-12-10 MED ORDER — ACETAMINOPHEN 325 MG PO TABS
650.0000 mg | ORAL_TABLET | Freq: Once | ORAL | Status: AC
  Administered 2020-12-10: 650 mg via ORAL
  Filled 2020-12-10: qty 2

## 2020-12-10 NOTE — ED Triage Notes (Addendum)
Pt sent in from SNF for unwitnessed fall. Pt found beside her bed, pt denies LOC. Pt is on hospice, sent from facility for wound closure of Lac to scalp. Pt does not take blood thinners. Pt has dementia, confused at baseline. Ccollar placed by medic.

## 2020-12-10 NOTE — ED Notes (Signed)
Attempted to call [facility] to give report to RN there for discharge. Call placed to ptar for pt transport back.

## 2020-12-10 NOTE — Progress Notes (Signed)
[employer], [employer] [facility]) Hospital Liaison Note:  Kayla Fowler is an [facility] patient at [facility] with a hospice diagnosis of Unspecified Severe Protein Calorie Malnutrition with encephalopathy. Patient has a DNR in place.  The facility notified our [facility] triage line that the patient had fallen with a large amount of blood noted and a laceration to the head.  The facility was advised to send the patient to the ER for evaluation and treatment. Patient is pleasantly confused at baseline.  An [facility] Liaison will follow patient during this hospitalization. Please let us know if you have any hospice related questions,  Thank you,  Roda Shutters, RN [facility] HLT [phone]

## 2020-12-10 NOTE — Discharge Instructions (Signed)
Your CT head and neck and x-rays were unremarkable today.  Staple removal in a week.  Continue Tylenol as needed for pain  See your doctor for follow up   Return to ER if you have uncontrolled bleeding, severe pain, vomiting

## 2020-12-10 NOTE — ED Notes (Signed)
Pt is dressed out into a gown

## 2020-12-10 NOTE — ED Notes (Signed)
Updates provided to pts social worker Jasmain at [phone]

## 2020-12-10 NOTE — ED Provider Notes (Signed)
[facility]-EMERGENCY DEPT Provider Note   CSN: [id number] Arrival date & time: 12/10/20  1536     History Chief Complaint  Patient presents with  . Fall    Kayla Fowler is a 87 y.o. female history of dementia, hypertension, here presenting with fall.  Patient is from a facility.  Patient had an unwitnessed fall and hit her head.  Patient has a scalp laceration that was bleeding.  She is under hospice but they wanted her to be transferred to the ER to close the wound since it was actively bleeding.  Patient unable to give any history.  No blood thinners per her chart.  Patient is also DNR and on hospice.  The history is provided by the nursing home.  Level V caveat- dementia      Past Medical History:  Diagnosis Date  . Dementia (HCC) hypertension  . Hypertension     Patient Active Problem List   Diagnosis Date Noted  . Hematoma of left thigh 03/31/2020  . Left displaced femoral neck fracture (HCC) 03/26/2020  . Dementia without behavioral disturbance (HCC) 03/26/2020  . Accidental fall 03/26/2020  . Preoperative clearance 03/26/2020  . Essential hypertension 03/26/2020  . Protein-calorie malnutrition, severe 03/26/2020  . Primary osteoarthritis involving multiple joints 12/29/2018  . Vascular dementia without behavioral disturbance (HCC) 09/23/2016    Past Surgical History:  Procedure Laterality Date  . HIP ARTHROPLASTY Left 03/26/2020   Procedure: ARTHROPLASTY BIPOLAR HIP (HEMIARTHROPLASTY);  Surgeon: Deeann Saint, MD;  Location: [facility] ORS;  Service: Orthopedics;  Laterality: Left;     OB History   No obstetric history on file.     No family history on file.  Social History   Tobacco Use  . Smoking status: Never Smoker  . Smokeless tobacco: Never Used  Substance Use Topics  . Alcohol use: Never  . Drug use: Never    Home Medications Prior to Admission medications   Medication Sig Start Date End Date Taking? Authorizing Provider   acetaminophen (TYLENOL) 500 MG tablet Take 1 tablet (500 mg total) by mouth every 6 (six) hours as needed. 04/02/20   Arrien, York Ram, MD  Cholecalciferol 50 MCG (2000 UT) TABS Take 2,000 Units by mouth daily.    [provider]  diclofenac sodium (VOLTAREN) 1 % GEL Apply 2 g topically 2 (two) times daily. Apply to right shoulder and neck    [provider]  DULoxetine (CYMBALTA) 20 MG capsule Take 40 mg by mouth daily.     [provider]  HYDROcodone-acetaminophen (NORCO/VICODIN) 5-325 MG tablet Take 1 tablet by mouth every 8 (eight) hours as needed for severe pain. 04/02/20   Arrien, York Ram, MD  loperamide (IMODIUM A-D) 2 MG tablet Take 2-4 mg by mouth 4 (four) times daily as needed for diarrhea or loose stools. Take 2 tablets (4 mg) when diarrhea starts, then 1 tablet (2 mg) for additional doses    [provider]  Melatonin 3 MG TABS Take 3 mg by mouth at bedtime.     [provider]  Menthol (HALLS COUGH DROPS) 5.8 MG LOZG Use as directed 1 lozenge in the mouth or throat every 3 (three) hours as needed (sore throat).    [provider]  mirtazapine (REMERON) 15 MG tablet Take 7.5 mg by mouth at bedtime.    [provider]  Multiple Vitamins-Minerals (THEREMS-M) TABS Take 1 tablet by mouth daily.    [provider]  senna (SENOKOT) 8.6 MG TABS  tablet Take 1 tablet by mouth at bedtime as needed for mild constipation.    [provider]  traZODone (DESYREL) 100 MG tablet Take 100 mg by mouth at bedtime.    [provider]    Allergies    Ibuprofen, Neomycin, Percocet [oxycodone-acetaminophen], and Triple antibiotic [bacitracin-neomycin-polymyxin]  Review of Systems   Review of Systems  Skin: Positive for wound.  All other systems reviewed and are negative.   Physical Exam Updated Vital Signs BP (!) 112/53 (BP Location: Right Arm)   Pulse 67   Temp 97.7 F (36.5 C) (Oral)   Resp  18   SpO2 97%   Physical Exam Vitals and nursing note reviewed.  Constitutional:      Comments: Demented and confused  HENT:     Head:     Comments: 2 cm laceration in the right scalp area.  There is some dried blood around it     Mouth/Throat:     Mouth: Mucous membranes are moist.  Eyes:     Extraocular Movements: Extraocular movements intact.     Pupils: Pupils are equal, round, and reactive to light.  Neck:     Comments: C-collar in place Cardiovascular:     Rate and Rhythm: Normal rate and regular rhythm.     Pulses: Normal pulses.     Heart sounds: Normal heart sounds.  Pulmonary:     Effort: Pulmonary effort is normal.     Breath sounds: Normal breath sounds.  Abdominal:     General: Abdomen is flat.     Palpations: Abdomen is soft.  Musculoskeletal:     Comments: No obvious spinal tenderness.  Pelvis is stable and normal range of motion bilateral hips  Skin:    General: Skin is warm.     Capillary Refill: Capillary refill takes less than 2 seconds.  Neurological:     Comments: Demented and moving all extremities.  Not following commands  Psychiatric:     Comments: Unable      ED Results / Procedures / Treatments   Labs (all labs ordered are listed, but only abnormal results are displayed) Labs Reviewed - No data to display  EKG None  Radiology DG Chest 1 View  Result Date: 12/10/2020 CLINICAL DATA:  Unwitnessed fall at nursing home. EXAM: CHEST  1 VIEW COMPARISON:  03/26/2020 aortic atherosclerosis FINDINGS: The cardiomediastinal contours are normal. Aortic atherosclerosis. Pulmonary vasculature is normal. No consolidation, pleural effusion, or pneumothorax. Skin fold projects over left upper and lateral right hemithorax. No acute osseous abnormalities are seen. Reverse left shoulder arthroplasty partially included. Degenerative change of the right shoulder. There are remote left lateral rib fractures. IMPRESSION: No acute abnormality. Electronically Signed    By: Narda Rutherford M.D.   On: 12/10/2020 17:27   DG Pelvis 1-2 Views  Result Date: 12/10/2020 CLINICAL DATA:  Nursing home patient post unwitnessed fall. EXAM: PELVIS - 1-2 VIEW COMPARISON:  03/26/2020 FINDINGS: Bilateral hip arthroplasties, intact. No periprosthetic lucency or fracture. Bony pelvis is intact without acute fracture. The pubic symphysis and sacroiliac joints are congruent. Bones are diffusely under mineralized. IMPRESSION: Intact bilateral hip arthroplasties without periprosthetic fracture or complication. No pelvic fracture. Electronically Signed   By: Narda Rutherford M.D.   On: 12/10/2020 17:29   CT Head Wo Contrast  Result Date: 12/10/2020 CLINICAL DATA:  Status post trauma. EXAM: CT HEAD WITHOUT CONTRAST TECHNIQUE: Contiguous axial images were obtained from the base of the skull through the vertex without intravenous contrast. COMPARISON:  March 26, 2020 FINDINGS: Brain: There is mild cerebral atrophy with widening of the extra-axial spaces and ventricular dilatation. There are areas of decreased attenuation within the white matter tracts of the supratentorial brain, consistent with microvascular disease changes. Vascular: No hyperdense vessel or unexpected calcification. Skull: Normal. Negative for fracture or focal lesion. Sinuses/Orbits: No acute finding. Other: Mild right frontal scalp soft tissue swelling is seen. IMPRESSION: 1. Generalized cerebral atrophy. 2. Mild right frontal scalp soft tissue swelling without an acute intracranial abnormality. Electronically Signed   By: Aram Candela M.D.   On: 12/10/2020 17:14   CT Cervical Spine Wo Contrast  Result Date: 12/10/2020 CLINICAL DATA:  Status post trauma. EXAM: CT CERVICAL SPINE WITHOUT CONTRAST TECHNIQUE: Multidetector CT imaging of the cervical spine was performed without intravenous contrast. Multiplanar CT image reconstructions were also generated. COMPARISON:  March 26, 2020 FINDINGS: Alignment: 1 mm to 2 mm  anterolisthesis of the C2 vertebral body is noted on C3. 2 mm to 3 mm anterolisthesis of C4 is noted on C5. 2 mm retrolisthesis of C5 is noted on C6. Skull base and vertebrae: No acute fracture. Chronic changes are seen involving the body and tip of the dens. Soft tissues and spinal canal: No prevertebral fluid or swelling. No visible canal hematoma. Disc levels: Marked severity endplate sclerosis is seen at the levels of C4-C5 and C5-C6. Marked severity narrowing of the anterior atlantoaxial articulation is seen. Marked severity intervertebral disc space narrowing is seen at the levels of C4-C5 and C5-C6. Mild intervertebral disc space narrowing is noted throughout the remainder of the cervical spine. Bilateral marked severity multilevel facet joint hypertrophy is noted. Upper chest: Stable biapical focal scarring and/or atelectasis is seen. Other: None. IMPRESSION: 1. No acute cervical spine fracture. 2. Marked severity multilevel degenerative changes, as described above. Electronically Signed   By: Aram Candela M.D.   On: 12/10/2020 17:18    Procedures Procedures   LACERATION REPAIR Performed by: Richardean Canal Authorized by: Richardean Canal Consent: Verbal consent obtained. Risks and benefits: risks, benefits and alternatives were discussed Consent given by: patient Patient identity confirmed: provided demographic data Prepped and Draped in normal sterile fashion Wound explored  Laceration Location: R scalp   Laceration Length: 2 cm  No Foreign Bodies seen or palpated  Anesthesia: none   Irrigation method: syringe Amount of cleaning: standard  Skin closure: staples  Number of staples: 2    Patient tolerance: Patient tolerated the procedure well with no immediate complications.    Medications Ordered in ED Medications  Tdap (BOOSTRIX) injection 0.5 mL (0.5 mLs Intramuscular Given 12/10/20 1623)  acetaminophen (TYLENOL) tablet 650 mg (650 mg Oral Given 12/10/20 1623)    ED  Course  I have reviewed the triage vital signs and the nursing notes.  Pertinent labs & imaging results that were available during my care of the patient were reviewed by me and considered in my medical decision making (see chart for details).    MDM Rules/Calculators/A&P                         Mykiah Schmuck is a 87 y.o. female here presenting with fall.  Patient fell and hit her head and has a scalp laceration.  Laceration was stapled.  Patient is DNR.  We will get CT head and neck.  We will update tetanus  5:42 PM CT and xrays unremarkable. Staple removal in a week   Final Clinical Impression(s) /  ED Diagnoses Final diagnoses:  Fall    Rx / DC Orders ED Discharge Orders    None       Charlynne PanderYao, Talyn Dessert Hsienta, MD 12/10/20 1742

## 2021-04-17 ENCOUNTER — Emergency Department (HOSPITAL_COMMUNITY)
Admission: EM | Admit: 2021-04-17 | Discharge: 2021-04-17 | Disposition: A | Discharge status: Home / Self Care | Attending: Emergency Medicine | Admitting: Emergency Medicine

## 2021-04-17 ENCOUNTER — Other Ambulatory Visit: Payer: Self-pay

## 2021-04-17 ENCOUNTER — Encounter (HOSPITAL_COMMUNITY): Payer: Self-pay | Admitting: Emergency Medicine

## 2021-04-17 ENCOUNTER — Emergency Department (HOSPITAL_COMMUNITY)

## 2021-04-17 DIAGNOSIS — S52022A Displaced fracture of olecranon process without intraarticular extension of left ulna, initial encounter for closed fracture: Secondary | ICD-10-CM | POA: Insufficient documentation

## 2021-04-17 DIAGNOSIS — I10 Essential (primary) hypertension: Secondary | ICD-10-CM | POA: Insufficient documentation

## 2021-04-17 DIAGNOSIS — Z79899 Other long term (current) drug therapy: Secondary | ICD-10-CM | POA: Diagnosis not present

## 2021-04-17 DIAGNOSIS — Y92129 Unspecified place in nursing home as the place of occurrence of the external cause: Secondary | ICD-10-CM | POA: Diagnosis not present

## 2021-04-17 DIAGNOSIS — S6992XA Unspecified injury of left wrist, hand and finger(s), initial encounter: Secondary | ICD-10-CM | POA: Diagnosis present

## 2021-04-17 DIAGNOSIS — W1839XA Other fall on same level, initial encounter: Secondary | ICD-10-CM | POA: Diagnosis not present

## 2021-04-17 DIAGNOSIS — F0390 Unspecified dementia without behavioral disturbance: Secondary | ICD-10-CM | POA: Diagnosis not present

## 2021-04-17 DIAGNOSIS — W19XXXA Unspecified fall, initial encounter: Secondary | ICD-10-CM

## 2021-04-17 DIAGNOSIS — S42402A Unspecified fracture of lower end of left humerus, initial encounter for closed fracture: Secondary | ICD-10-CM

## 2021-04-17 DIAGNOSIS — Z96642 Presence of left artificial hip joint: Secondary | ICD-10-CM | POA: Diagnosis not present

## 2021-04-17 LAB — BASIC METABOLIC PANEL
Anion gap: 8 (ref 5–15)
BUN: 35 mg/dL — ABNORMAL HIGH (ref 8–23)
CO2: 29 mmol/L (ref 22–32)
Calcium: 9.4 mg/dL (ref 8.9–10.3)
Chloride: 102 mmol/L (ref 98–111)
Creatinine, Ser: 0.77 mg/dL (ref 0.44–1.00)
GFR, Estimated: >60 mL/min (ref >60)
Glucose, Bld: 114 mg/dL — ABNORMAL HIGH (ref 70–99)
Potassium: 4.4 mmol/L (ref 3.5–5.1)
Sodium: 139 mmol/L (ref 135–145)

## 2021-04-17 LAB — CBC WITH DIFFERENTIAL/PLATELET
Abs Immature Granulocytes: 0.05 10*3/uL (ref 0.00–0.07)
Basophils Absolute: 0 10*3/uL (ref 0.0–0.1)
Basophils Relative: 0 %
Eosinophils Absolute: 0.2 10*3/uL (ref 0.0–0.5)
Eosinophils Relative: 2 %
HCT: 39.4 % (ref 36.0–46.0)
Hemoglobin: 12.4 g/dL (ref 12.0–15.0)
Immature Granulocytes: 1 %
Lymphocytes Relative: 14 %
Lymphs Abs: 1.4 10*3/uL (ref 0.7–4.0)
MCH: 31.2 pg (ref 26.0–34.0)
MCHC: 31.5 g/dL (ref 30.0–36.0)
MCV: 99.2 fL (ref 80.0–100.0)
Monocytes Absolute: 1 10*3/uL (ref 0.1–1.0)
Monocytes Relative: 10 %
Neutro Abs: 7.3 10*3/uL (ref 1.7–7.7)
Neutrophils Relative %: 73 %
Platelets: 257 10*3/uL (ref 150–400)
RBC: 3.97 MIL/uL (ref 3.87–5.11)
RDW: 14.1 % (ref 11.5–15.5)
WBC: 10 10*3/uL (ref 4.0–10.5)
nRBC: 0 % (ref 0.0–0.2)

## 2021-04-17 LAB — TYPE AND SCREEN
ABO/RH(D): A POS
Antibody Screen: NEGATIVE

## 2021-04-17 MED ORDER — MORPHINE SULFATE (PF) 2 MG/ML IV SOLN
2.0000 mg | Freq: Once | INTRAVENOUS | Status: AC
  Administered 2021-04-17: 2 mg via INTRAMUSCULAR
  Filled 2021-04-17: qty 1

## 2021-04-17 NOTE — Discharge Instructions (Addendum)
Return for any problem.   Please follow up with Dr. Victorino Dike with [facility] in 1-2 days.   Splint should remain in place until follow up with Orthopedics.

## 2021-04-17 NOTE — ED Notes (Signed)
[facility] number called for transport services.

## 2021-04-17 NOTE — ED Provider Notes (Signed)
[facility]-EMERGENCY DEPT Provider Note   CSN: [id number] Arrival date & time: 04/17/21  1134     History Chief Complaint  Patient presents with   Fall   Elbow Pain    Kayla Fowler is a 87 y.o. female.  87 year old female with prior medical history as detailed below presents for evaluation after a fall.  She fell at her facility and landed hard on her left elbow.  Patient with complaint of left elbow pain and swelling.  Patient does have history of dementia.  Level 5 caveat secondary to history of dementia.  Patient is a poor historian.  Patient is currently in hospice care.  Her CODE STATUS is DNR.  The history is provided by the patient.  Fall This is a new problem. The current episode started 3 to 5 hours ago. The problem occurs rarely. The problem has not changed since onset.Nothing aggravates the symptoms. Nothing relieves the symptoms.      Past Medical History:  Diagnosis Date   Dementia (HCC) hypertension   Hypertension     Patient Active Problem List   Diagnosis Date Noted   Hematoma of left thigh 03/31/2020   Left displaced femoral neck fracture (HCC) 03/26/2020   Dementia without behavioral disturbance (HCC) 03/26/2020   Accidental fall 03/26/2020   Preoperative clearance 03/26/2020   Essential hypertension 03/26/2020   Protein-calorie malnutrition, severe 03/26/2020   Primary osteoarthritis involving multiple joints 12/29/2018   Vascular dementia without behavioral disturbance (HCC) 09/23/2016    Past Surgical History:  Procedure Laterality Date   HIP ARTHROPLASTY Left 03/26/2020   Procedure: ARTHROPLASTY BIPOLAR HIP (HEMIARTHROPLASTY);  Surgeon: Deeann Saint, MD;  Location: [facility] ORS;  Service: Orthopedics;  Laterality: Left;     OB History   No obstetric history on file.     History reviewed. No pertinent family history.  Social History   Tobacco Use   Smoking status: Never   Smokeless tobacco: Never  Substance Use  Topics   Alcohol use: Never   Drug use: Never    Home Medications Prior to Admission medications   Medication Sig Start Date End Date Taking? Authorizing Provider  acetaminophen (TYLENOL) 500 MG tablet Take 1 tablet (500 mg total) by mouth every 6 (six) hours as needed. 04/02/20   Arrien, York Ram, MD  Cholecalciferol 50 MCG (2000 UT) TABS Take 2,000 Units by mouth daily.    [provider]  diclofenac sodium (VOLTAREN) 1 % GEL Apply 2 g topically 2 (two) times daily. Apply to right shoulder and neck    [provider]  DULoxetine (CYMBALTA) 20 MG capsule Take 40 mg by mouth daily.     [provider]  HYDROcodone-acetaminophen (NORCO/VICODIN) 5-325 MG tablet Take 1 tablet by mouth every 8 (eight) hours as needed for severe pain. 04/02/20   Arrien, York Ram, MD  loperamide (IMODIUM A-D) 2 MG tablet Take 2-4 mg by mouth 4 (four) times daily as needed for diarrhea or loose stools. Take 2 tablets (4 mg) when diarrhea starts, then 1 tablet (2 mg) for additional doses    [provider]  Melatonin 3 MG TABS Take 3 mg by mouth at bedtime.     [provider]  Menthol (HALLS COUGH DROPS) 5.8 MG LOZG Use as directed 1 lozenge in the mouth or throat every 3 (three) hours as needed (sore throat).    [provider]  mirtazapine (REMERON) 15 MG tablet Take 7.5 mg by mouth at bedtime.  [provider]  Multiple Vitamins-Minerals (THEREMS-M) TABS Take 1 tablet by mouth daily.    [provider]  senna (SENOKOT) 8.6 MG TABS tablet Take 1 tablet by mouth at bedtime as needed for mild constipation.    [provider]  traZODone (DESYREL) 100 MG tablet Take 100 mg by mouth at bedtime.    [provider]    Allergies    Ibuprofen, Neomycin, Percocet [oxycodone-acetaminophen], and Triple antibiotic [bacitracin-neomycin-polymyxin]  Review of Systems   Review of Systems  All other systems reviewed and are  negative.  Physical Exam Updated Vital Signs BP (!) 119/94 (BP Location: Right Arm)   Pulse 89   Temp 98.2 F (36.8 C) (Oral)   Resp 19   SpO2 100%   Physical Exam Vitals and nursing note reviewed.  Constitutional:      General: She is not in acute distress.    Appearance: Normal appearance. She is well-developed.  HENT:     Head: Normocephalic and atraumatic.  Eyes:     Conjunctiva/sclera: Conjunctivae normal.     Pupils: Pupils are equal, round, and reactive to light.  Cardiovascular:     Rate and Rhythm: Normal rate and regular rhythm.     Heart sounds: Normal heart sounds.  Pulmonary:     Effort: Pulmonary effort is normal. No respiratory distress.     Breath sounds: Normal breath sounds.  Abdominal:     General: There is no distension.     Palpations: Abdomen is soft.     Tenderness: There is no abdominal tenderness.  Musculoskeletal:        General: Tenderness present. No deformity. Normal range of motion.     Cervical back: Normal range of motion and neck supple.     Comments: Tenderness to palpation noted to the left elbow.  Joint itself is intact.  No open laceration noted.  Significant ecchymosis noted around the left elbow itself.  Skin:    General: Skin is warm and dry.  Neurological:     General: No focal deficit present.     Mental Status: She is alert and oriented to person, place, and time.    ED Results / Procedures / Treatments   Labs (all labs ordered are listed, but only abnormal results are displayed) Labs Reviewed  BASIC METABOLIC PANEL - Abnormal; Notable for the following components:      Result Value   Glucose, Bld 114 (*)    BUN 35 (*)    All other components within normal limits  CBC WITH DIFFERENTIAL/PLATELET  TYPE AND SCREEN    EKG None  Radiology DG Chest 1 View  Result Date: 04/17/2021 CLINICAL DATA:  Fall, pain EXAM: CHEST  1 VIEW COMPARISON:  Radiograph 12/10/2020 FINDINGS: Unchanged cardiomediastinal silhouette. There is  no new focal airspace disease. There is no large pleural effusion or visible pneumothorax. Likely chronic interstitial changes. Right shoulder degenerative changes. Prior left reverse total shoulder arthroplasty. Unchanged compression deformity of T11. Unchanged old healed left lateral rib fractures. IMPRESSION: No evidence of acute cardiopulmonary disease. Electronically Signed   By: Caprice Renshaw M.D.   On: 04/17/2021 12:56   DG Pelvis 1-2 Views  Result Date: 04/17/2021 CLINICAL DATA:  Larey Seat. EXAM: PELVIS - 1-2 VIEW COMPARISON:  12/10/2020 FINDINGS: Bilateral hip hemiarthroplasty is are intact. No periprosthetic fracture is identified. The pubic symphysis and SI joints are intact. No definite pelvic fractures. IMPRESSION: No acute bony findings. Electronically Signed   By: Orlene Plum.D.  On: 04/17/2021 12:56   DG Elbow Complete Left  Result Date: 04/17/2021 CLINICAL DATA:  Fall EXAM: LEFT ELBOW - COMPLETE 3+ VIEW COMPARISON:  None. FINDINGS: There is a displaced fracture of the olecranon with approximately 2.0 cm proximal displacement of the fractured fragment. The radial head appears abnormal on the frontal projection but normal on the lateral projection. No definite fracture is seen, findings are favored to be positional. Radiocapitellar alignment is maintained. There is a small effusion and soft tissue swelling over the olecranon. IMPRESSION: Displaced fracture of the olecranon as above. Electronically Signed   By: Lesia Hausen M.D.   On: 04/17/2021 12:58   CT Head Wo Contrast  Result Date: 04/17/2021 CLINICAL DATA:  Fall EXAM: CT HEAD WITHOUT CONTRAST TECHNIQUE: Contiguous axial images were obtained from the base of the skull through the vertex without intravenous contrast. COMPARISON:  CT head 12/10/2020 FINDINGS: Image quality is mildly motion degraded. Brain: There is no acute intracranial hemorrhage, extra-axial fluid collection, or infarct. There is mild parenchymal volume loss with  commensurate enlargement of the ventricular system. There is mild chronic white matter microangiopathy. There is no mass lesion. There is no midline shift. Vascular: There is calcification of the bilateral cavernous ICAs. Skull: Normal. Negative for fracture or focal lesion. Sinuses/Orbits: The imaged paranasal sinuses are clear. Bilateral lens implants are in place. The globes and orbits are otherwise unremarkable. Other: The mastoid air cells are clear. IMPRESSION: No acute intracranial hemorrhage or calvarial fracture. Electronically Signed   By: Lesia Hausen M.D.   On: 04/17/2021 13:02   CT Cervical Spine Wo Contrast  Result Date: 04/17/2021 CLINICAL DATA:  Fall EXAM: CT CERVICAL SPINE WITHOUT CONTRAST TECHNIQUE: Multidetector CT imaging of the cervical spine was performed without intravenous contrast. Multiplanar CT image reconstructions were also generated. COMPARISON:  CT cervical spine 12/10/2020 FINDINGS: Alignment: There is reversal of the normal cervical spine lordosis centered at C4-C5. There is grade 1 anterolisthesis of C2 on C3, C3 on C4, and C4 on C5, grade 1 retrolisthesis C5 on C6, and grade 1 anterolisthesis of C7 on T1 and T1 on T2, all likely degenerative in nature and not significantly changed since the prior study. Slight offset of the right C1 lateral mass relative to C2 is unchanged going back to 2020. There is no evidence of traumatic malalignment. There is no jumped or perched facet. Skull base and vertebrae: Skull base alignment is maintained. Vertebral body heights are preserved. There is no evidence of acute fracture. Soft tissues and spinal canal: No prevertebral fluid or swelling. No visible canal hematoma. Disc levels: There is advanced degenerative change at the craniocervical junction and throughout the remainder of the cervical spine, most severe at C4-C5 and C5-C6 where there is moderate spinal canal stenosis and moderate to severe bilateral neural foraminal stenosis. The  findings are overall similar to the prior study. Upper chest: There is scarring in the lung apices. Other: None. IMPRESSION: 1. No acute fracture or traumatic malalignment of the cervical spine. 2. Advanced multilevel spondylosis and spondylolisthesis as above, overall similar to the prior study. Electronically Signed   By: Lesia Hausen M.D.   On: 04/17/2021 13:07   DG Humerus Left  Result Date: 04/17/2021 CLINICAL DATA:  Larey Seat.  Left arm pain. EXAM: LEFT HUMERUS - 2+ VIEW COMPARISON:  None. FINDINGS: Reversed total left shoulder arthroplasty components appear in good position without complicating features. No acute fractures are identified. Remote healed left rib fractures are noted. No definite acute fractures. IMPRESSION:  No acute bony findings. Electronically Signed   By: Rudie Meyer M.D.   On: 04/17/2021 12:58    Procedures Procedures   Medications Ordered in ED Medications  morphine 2 MG/ML injection 2 mg (2 mg Intramuscular Given 04/17/21 1205)    ED Course  I have reviewed the triage vital signs and the nursing notes.  Pertinent labs & imaging results that were available during my care of the patient were reviewed by me and considered in my medical decision making (see chart for details).    MDM Rules/Calculators/A&P                           MDM  MSE complete  Kayla Fowler was evaluated in Emergency Department on 04/17/2021 for the symptoms described in the history of present illness. She was evaluated in the context of the global COVID-19 pandemic, which necessitated consideration that the patient might be at risk for infection with the SARS-CoV-2 virus that causes COVID-19. Institutional protocols and algorithms that pertain to the evaluation of patients at risk for COVID-19 are in a state of rapid change based on information released by regulatory bodies including the CDC and federal and state organizations. These policies and algorithms were followed during the patient's care  in the ED.  Patient with fall from standing and injury to the left elbow.  Plain films demonstrate left olecranon process fracture.  Case discussed with Dr. Victorino Dike of [facility].  Patient is appropriate for outpatient follow-up.  Sling and splint applied here in the ED.  Patient without evidence of other injury found on work-up.  Importance of close follow-up is stressed.  Strict return precautions given and understood.  Plan for outpatient care communicated to patient's hospice coordinators [facility]) to help coordinate outpatient follow-up.   Final Clinical Impression(s) / ED Diagnoses Final diagnoses:  Fall, initial encounter  Closed fracture of left elbow, initial encounter    Rx / DC Orders ED Discharge Orders     None        Wynetta Fines, MD 04/17/21 1401

## 2021-04-17 NOTE — ED Notes (Signed)
Report given to nurse manager at [facility].

## 2021-04-17 NOTE — ED Triage Notes (Signed)
BIBA Per EMS: Pt coming from [facility] w/ c/o fall that occurred this morning. Pt fell in dining room on her left elbow. Pt c/o elbow pain. Hx dementia. Denies blood thinners; denies hitting head, denies LOC. Pt has eye degeneration. Was 91% on RA. 97% 3L. 108/58 76 HR  18 RR  146 CBG

## 2021-04-17 NOTE — Progress Notes (Addendum)
[facility])      This patient is a current hospice patient with [facility], admitted in March 2022 with a terminal diagnosis of severe protein calorie malnutrition. Pt is in memory care at [facility]. Pt has an out of facility DNR in place.  [facility] will continue to follow for any discharge planning needs and to coordinate continuation of hospice care.    Thank you for the opportunity to participate in this patient's care.     Gillian Scarce, BSN, RN [facility] Liaison [phone] (24h on call)

## 2021-04-22 ENCOUNTER — Ambulatory Visit: Payer: Medicare HMO | Admitting: Orthopedic Surgery

## 2021-05-02 ENCOUNTER — Ambulatory Visit: Payer: Medicare HMO | Admitting: Surgical

## 2021-05-02 ENCOUNTER — Ambulatory Visit: Payer: Medicare HMO | Admitting: Orthopedic Surgery

## 2021-05-16 ENCOUNTER — Ambulatory Visit: Payer: Self-pay

## 2021-05-16 ENCOUNTER — Ambulatory Visit (INDEPENDENT_AMBULATORY_CARE_PROVIDER_SITE_OTHER): Admitting: Orthopedic Surgery

## 2021-05-16 ENCOUNTER — Other Ambulatory Visit: Payer: Self-pay

## 2021-05-16 DIAGNOSIS — M25522 Pain in left elbow: Secondary | ICD-10-CM | POA: Diagnosis not present

## 2021-05-17 ENCOUNTER — Encounter: Payer: Self-pay | Admitting: Orthopedic Surgery

## 2021-05-17 NOTE — Progress Notes (Signed)
Office Visit Note   Patient: Kayla Fowler           Date of Birth: 1927-06-24           MRN: [id number] Visit Date: 05/16/2021 Requested by: Theodosia Blender, MD [address] [phone] [facility] [place],  [place] [address] PCP: Theodosia Blender, MD  Subjective: Chief Complaint  Patient presents with   Left Elbow - Injury    HPI: Kayla Fowler is a 87 year old patient with left elbow pain.  She had an injury 1 month ago to her left elbow.  Since that time she has missed a couple of appointments because she has had COVID.  Currently she reports pain in the elbow with flexion but otherwise she is able to manage.  She does not walk much.  She is currently in a wheelchair.  Denies any other orthopedic complaints.              ROS: All systems reviewed are negative as they relate to the chief complaint within the history of present illness.  Patient denies  fevers or chills.   Assessment & Plan: Visit Diagnoses:  1. Pain in left elbow     Plan: Impression is left elbow pain with displaced olecranon fracture in a 87 year old patient.  She has reasonable function and is not a great operative candidate at this time due to poor bone quality and length of time since the injury.  In general we talked about operative and nonoperative management.  Overall I think she would do well as she is right now with the displaced fracture.  Patient understands and we will see her back as needed.  Follow-Up Instructions: Return if symptoms worsen or fail to improve.   Orders:  Orders Placed This Encounter  Procedures   XR Elbow 2 Views Left   No orders of the defined types were placed in this encounter.     Procedures: No procedures performed   Clinical Data: No additional findings.  Objective: Vital Signs: There were no vitals taken for this visit.  Physical Exam:   Constitutional: Patient appears well-developed HEENT:  Head: Normocephalic Eyes:EOM are normal Neck: Normal  range of motion Cardiovascular: Normal rate Pulmonary/chest: Effort normal Neurologic: Patient is alert Skin: Skin is warm Psychiatric: Patient has normal mood and affect   Ortho Exam: Ortho exam demonstrates active and passive range of motion of the elbow from 30-1 10.  Some mild pain with flexion past 110.  Surprisingly she does have about 4 out of 5 extension strength on the left.  Less so than the right.  Radial pulses intact.  Elbow is located.  Swelling is present but the skin is intact in the left elbow region.  Specialty Comments:  No specialty comments available.  Imaging: XR Elbow 2 Views Left  Result Date: 05/17/2021 AP lateral radiographs left elbow reviewed.  3 cm displaced olecranon fracture is present.  Elbow is located.  No additional fractures.  Bones osteopenic.    PMFS History: Patient Active Problem List   Diagnosis Date Noted   Hematoma of left thigh 03/31/2020   Left displaced femoral neck fracture (HCC) 03/26/2020   Dementia without behavioral disturbance (HCC) 03/26/2020   Accidental fall 03/26/2020   Preoperative clearance 03/26/2020   Essential hypertension 03/26/2020   Protein-calorie malnutrition, severe 03/26/2020   Primary osteoarthritis involving multiple joints 12/29/2018   Vascular dementia without behavioral disturbance (HCC) 09/23/2016   Past Medical History:  Diagnosis Date   Dementia (HCC)  hypertension   Hypertension     History reviewed. No pertinent family history.  Past Surgical History:  Procedure Laterality Date   HIP ARTHROPLASTY Left 03/26/2020   Procedure: ARTHROPLASTY BIPOLAR HIP (HEMIARTHROPLASTY);  Surgeon: Deeann Saint, MD;  Location: [facility] ORS;  Service: Orthopedics;  Laterality: Left;   Social History   Occupational History   Not on file  Tobacco Use   Smoking status: Never   Smokeless tobacco: Never  Substance and Sexual Activity   Alcohol use: Never   Drug use: Never   Sexual activity: Not Currently
